# Patient Record
Sex: Female | Born: 1985 | Hispanic: No | Marital: Single | Smoking: Never smoker
Health system: Southern US, Community
[De-identification: ages and names within clinical notes are randomized; demographics above are authoritative.]

## PROBLEM LIST (undated history)

## (undated) DIAGNOSIS — S32810A Multiple fractures of pelvis with stable disruption of pelvic ring, initial encounter for closed fracture: Secondary | ICD-10-CM

## (undated) DIAGNOSIS — S32462A Displaced associated transverse-posterior fracture of left acetabulum, initial encounter for closed fracture: Secondary | ICD-10-CM

---

## 2017-11-25 ENCOUNTER — Emergency Department (HOSPITAL_COMMUNITY): Payer: 59 | Admitting: Certified Registered Nurse Anesthetist

## 2017-11-25 ENCOUNTER — Emergency Department (HOSPITAL_COMMUNITY): Payer: 59

## 2017-11-25 ENCOUNTER — Inpatient Hospital Stay (HOSPITAL_COMMUNITY): Payer: 59

## 2017-11-25 ENCOUNTER — Encounter (HOSPITAL_COMMUNITY): Admission: EM | Disposition: A | Discharge status: Home / Self Care | Payer: Self-pay | Source: Home / Self Care

## 2017-11-25 ENCOUNTER — Emergency Department (HOSPITAL_BASED_OUTPATIENT_CLINIC_OR_DEPARTMENT_OTHER): Payer: 59

## 2017-11-25 ENCOUNTER — Inpatient Hospital Stay (HOSPITAL_COMMUNITY)
Admission: EM | Admit: 2017-11-25 | Discharge: 2017-12-06 | DRG: 956 | Disposition: A | Discharge status: Home / Self Care | Payer: 59 | Attending: General Surgery | Admitting: General Surgery

## 2017-11-25 DIAGNOSIS — S32462A Displaced associated transverse-posterior fracture of left acetabulum, initial encounter for closed fracture: Secondary | ICD-10-CM | POA: Diagnosis present

## 2017-11-25 DIAGNOSIS — S72352D Displaced comminuted fracture of shaft of left femur, subsequent encounter for closed fracture with routine healing: Secondary | ICD-10-CM

## 2017-11-25 DIAGNOSIS — S81002A Unspecified open wound, left knee, initial encounter: Secondary | ICD-10-CM | POA: Diagnosis not present

## 2017-11-25 DIAGNOSIS — T819XXA Unspecified complication of procedure, initial encounter: Secondary | ICD-10-CM

## 2017-11-25 DIAGNOSIS — S32402A Unspecified fracture of left acetabulum, initial encounter for closed fracture: Secondary | ICD-10-CM

## 2017-11-25 DIAGNOSIS — S27322A Contusion of lung, bilateral, initial encounter: Secondary | ICD-10-CM | POA: Diagnosis present

## 2017-11-25 DIAGNOSIS — D72828 Other elevated white blood cell count: Secondary | ICD-10-CM | POA: Diagnosis not present

## 2017-11-25 DIAGNOSIS — Z09 Encounter for follow-up examination after completed treatment for conditions other than malignant neoplasm: Secondary | ICD-10-CM

## 2017-11-25 DIAGNOSIS — S32423A Displaced fracture of posterior wall of unspecified acetabulum, initial encounter for closed fracture: Secondary | ICD-10-CM | POA: Diagnosis present

## 2017-11-25 DIAGNOSIS — S3282XA Multiple fractures of pelvis without disruption of pelvic ring, initial encounter for closed fracture: Secondary | ICD-10-CM | POA: Diagnosis present

## 2017-11-25 DIAGNOSIS — K661 Hemoperitoneum: Secondary | ICD-10-CM | POA: Diagnosis present

## 2017-11-25 DIAGNOSIS — I959 Hypotension, unspecified: Secondary | ICD-10-CM | POA: Diagnosis present

## 2017-11-25 DIAGNOSIS — Z938 Other artificial opening status: Secondary | ICD-10-CM

## 2017-11-25 DIAGNOSIS — J9811 Atelectasis: Secondary | ICD-10-CM | POA: Diagnosis present

## 2017-11-25 DIAGNOSIS — S32129A Unspecified Zone II fracture of sacrum, initial encounter for closed fracture: Secondary | ICD-10-CM | POA: Diagnosis present

## 2017-11-25 DIAGNOSIS — I313 Pericardial effusion (noninflammatory): Secondary | ICD-10-CM | POA: Diagnosis present

## 2017-11-25 DIAGNOSIS — S7292XA Unspecified fracture of left femur, initial encounter for closed fracture: Secondary | ICD-10-CM

## 2017-11-25 DIAGNOSIS — S72352A Displaced comminuted fracture of shaft of left femur, initial encounter for closed fracture: Principal | ICD-10-CM | POA: Diagnosis present

## 2017-11-25 DIAGNOSIS — R402412 Glasgow coma scale score 13-15, at arrival to emergency department: Secondary | ICD-10-CM | POA: Diagnosis present

## 2017-11-25 DIAGNOSIS — Z452 Encounter for adjustment and management of vascular access device: Secondary | ICD-10-CM

## 2017-11-25 DIAGNOSIS — Z4682 Encounter for fitting and adjustment of non-vascular catheter: Secondary | ICD-10-CM

## 2017-11-25 DIAGNOSIS — E861 Hypovolemia: Secondary | ICD-10-CM

## 2017-11-25 DIAGNOSIS — I9589 Other hypotension: Secondary | ICD-10-CM

## 2017-11-25 DIAGNOSIS — S32810A Multiple fractures of pelvis with stable disruption of pelvic ring, initial encounter for closed fracture: Secondary | ICD-10-CM | POA: Diagnosis present

## 2017-11-25 DIAGNOSIS — S81012A Laceration without foreign body, left knee, initial encounter: Secondary | ICD-10-CM | POA: Diagnosis present

## 2017-11-25 DIAGNOSIS — D62 Acute posthemorrhagic anemia: Secondary | ICD-10-CM | POA: Diagnosis not present

## 2017-11-25 DIAGNOSIS — T1490XA Injury, unspecified, initial encounter: Secondary | ICD-10-CM

## 2017-11-25 DIAGNOSIS — R52 Pain, unspecified: Secondary | ICD-10-CM

## 2017-11-25 DIAGNOSIS — Y92410 Unspecified street and highway as the place of occurrence of the external cause: Secondary | ICD-10-CM

## 2017-11-25 DIAGNOSIS — I312 Hemopericardium, not elsewhere classified: Secondary | ICD-10-CM | POA: Diagnosis present

## 2017-11-25 DIAGNOSIS — M25521 Pain in right elbow: Secondary | ICD-10-CM | POA: Diagnosis not present

## 2017-11-25 DIAGNOSIS — R609 Edema, unspecified: Secondary | ICD-10-CM

## 2017-11-25 DIAGNOSIS — Z419 Encounter for procedure for purposes other than remedying health state, unspecified: Secondary | ICD-10-CM

## 2017-11-25 DIAGNOSIS — R58 Hemorrhage, not elsewhere classified: Secondary | ICD-10-CM

## 2017-11-25 HISTORY — PX: SUBXYPHOID PERICARDIAL WINDOW: SHX5075

## 2017-11-25 HISTORY — DX: Displaced associated transverse-posterior fracture of left acetabulum, initial encounter for closed fracture: S32.462A

## 2017-11-25 HISTORY — PX: EXTERNAL FIXATION LEG: SHX1549

## 2017-11-25 HISTORY — DX: Multiple fractures of pelvis with stable disruption of pelvic ring, initial encounter for closed fracture: S32.810A

## 2017-11-25 LAB — BPAM FFP
BLOOD PRODUCT EXPIRATION DATE: 201904132359
Blood Product Expiration Date: 201904122359
ISSUE DATE / TIME: 201904070455
ISSUE DATE / TIME: 201904070455
UNIT TYPE AND RH: 6200
UNIT TYPE AND RH: 6200

## 2017-11-25 LAB — I-STAT CHEM 8, ED
BUN: 7 mg/dL (ref 6–20)
CALCIUM ION: 1.09 mmol/L — AB (ref 1.15–1.40)
CHLORIDE: 107 mmol/L (ref 101–111)
CREATININE: 1 mg/dL (ref 0.44–1.00)
Glucose, Bld: 197 mg/dL — ABNORMAL HIGH (ref 65–99)
HCT: 36 % (ref 36.0–46.0)
Hemoglobin: 12.2 g/dL (ref 12.0–15.0)
Potassium: 2.7 mmol/L — CL (ref 3.5–5.1)
Sodium: 142 mmol/L (ref 135–145)
TCO2: 19 mmol/L — AB (ref 22–32)

## 2017-11-25 LAB — COMPREHENSIVE METABOLIC PANEL
ALK PHOS: 59 U/L (ref 38–126)
ALT: 72 U/L — ABNORMAL HIGH (ref 14–54)
ANION GAP: 13 (ref 5–15)
AST: 171 U/L — ABNORMAL HIGH (ref 15–41)
Albumin: 3.5 g/dL (ref 3.5–5.0)
BILIRUBIN TOTAL: 0.6 mg/dL (ref 0.3–1.2)
BUN: 8 mg/dL (ref 6–20)
CO2: 17 mmol/L — AB (ref 22–32)
CREATININE: 1.19 mg/dL — AB (ref 0.44–1.00)
Calcium: 7.8 mg/dL — ABNORMAL LOW (ref 8.9–10.3)
Chloride: 109 mmol/L (ref 101–111)
GFR calc Af Amer: 31 mL/min — ABNORMAL LOW (ref >60)
GFR, EST NON AFRICAN AMERICAN: 27 mL/min — AB (ref >60)
GLUCOSE: 204 mg/dL — AB (ref 65–99)
POTASSIUM: 2.8 mmol/L — AB (ref 3.5–5.1)
Sodium: 139 mmol/L (ref 135–145)
Total Protein: 6.5 g/dL (ref 6.5–8.1)

## 2017-11-25 LAB — CBC WITH DIFFERENTIAL/PLATELET
Basophils Absolute: 0 10*3/uL (ref 0.0–0.1)
Basophils Relative: 0 %
EOS PCT: 0 %
Eosinophils Absolute: 0 10*3/uL (ref 0.0–0.7)
HCT: 29.4 % — ABNORMAL LOW (ref 36.0–46.0)
Hemoglobin: 9.5 g/dL — ABNORMAL LOW (ref 12.0–15.0)
LYMPHS ABS: 1.3 10*3/uL (ref 0.7–4.0)
LYMPHS PCT: 7 %
MCH: 27.5 pg (ref 26.0–34.0)
MCHC: 32.3 g/dL (ref 30.0–36.0)
MCV: 85.2 fL (ref 78.0–100.0)
MONO ABS: 1.3 10*3/uL — AB (ref 0.1–1.0)
MONOS PCT: 7 %
Neutro Abs: 15 10*3/uL — ABNORMAL HIGH (ref 1.7–7.7)
Neutrophils Relative %: 86 %
PLATELETS: 148 10*3/uL — AB (ref 150–400)
RBC: 3.45 MIL/uL — ABNORMAL LOW (ref 3.87–5.11)
RDW: 13.6 % (ref 11.5–15.5)
WBC: 17.5 10*3/uL — ABNORMAL HIGH (ref 4.0–10.5)

## 2017-11-25 LAB — LACTIC ACID, PLASMA: Lactic Acid, Venous: 3.3 mmol/L (ref 0.5–1.9)

## 2017-11-25 LAB — PREPARE FRESH FROZEN PLASMA
UNIT DIVISION: 0
Unit division: 0

## 2017-11-25 LAB — PREPARE RBC (CROSSMATCH)

## 2017-11-25 LAB — PROTIME-INR
INR: 1.3
Prothrombin Time: 16.1 seconds — ABNORMAL HIGH (ref 11.4–15.2)

## 2017-11-25 LAB — CBC
HCT: 36.3 % (ref 36.0–46.0)
HEMOGLOBIN: 11.7 g/dL — AB (ref 12.0–15.0)
MCH: 27.7 pg (ref 26.0–34.0)
MCHC: 32.2 g/dL (ref 30.0–36.0)
MCV: 86 fL (ref 78.0–100.0)
PLATELETS: 218 10*3/uL (ref 150–400)
RBC: 4.22 MIL/uL (ref 3.87–5.11)
RDW: 13.5 % (ref 11.5–15.5)
WBC: 22.6 10*3/uL — ABNORMAL HIGH (ref 4.0–10.5)

## 2017-11-25 LAB — BLOOD PRODUCT ORDER (VERBAL) VERIFICATION

## 2017-11-25 LAB — ECHO INTRAOPERATIVE TEE
Height: 65 in
Weight: 2080 oz

## 2017-11-25 LAB — I-STAT BETA HCG BLOOD, ED (MC, WL, AP ONLY): I-stat hCG, quantitative: <5 m[IU]/mL (ref <5)

## 2017-11-25 LAB — BASIC METABOLIC PANEL
Anion gap: 9 (ref 5–15)
BUN: 8 mg/dL (ref 6–20)
CO2: 20 mmol/L — ABNORMAL LOW (ref 22–32)
Calcium: 7.6 mg/dL — ABNORMAL LOW (ref 8.9–10.3)
Chloride: 110 mmol/L (ref 101–111)
Creatinine, Ser: 0.94 mg/dL (ref 0.44–1.00)
GFR calc Af Amer: >60 mL/min (ref >60)
GLUCOSE: 121 mg/dL — AB (ref 65–99)
POTASSIUM: 4.6 mmol/L (ref 3.5–5.1)
Sodium: 139 mmol/L (ref 135–145)

## 2017-11-25 LAB — URINALYSIS, ROUTINE W REFLEX MICROSCOPIC
BACTERIA UA: NONE SEEN
BILIRUBIN URINE: NEGATIVE
Glucose, UA: NEGATIVE mg/dL
KETONES UR: NEGATIVE mg/dL
LEUKOCYTES UA: NEGATIVE
Nitrite: NEGATIVE
Protein, ur: NEGATIVE mg/dL
Specific Gravity, Urine: 1.024 (ref 1.005–1.030)
pH: 5 (ref 5.0–8.0)

## 2017-11-25 LAB — I-STAT CG4 LACTIC ACID, ED: LACTIC ACID, VENOUS: 5.19 mmol/L — AB (ref 0.5–1.9)

## 2017-11-25 LAB — CDS SEROLOGY

## 2017-11-25 LAB — ETHANOL: Alcohol, Ethyl (B): <10 mg/dL (ref <10)

## 2017-11-25 LAB — ECHOCARDIOGRAM LIMITED
HEIGHTINCHES: 65 in
WEIGHTICAEL: 2080 [oz_av]

## 2017-11-25 LAB — ABO/RH: ABO/RH(D): B POS

## 2017-11-25 SURGERY — CREATION, PERICARDIAL WINDOW, SUBXIPHOID APPROACH
Anesthesia: General | Site: Leg Lower

## 2017-11-25 MED ORDER — 0.9 % SODIUM CHLORIDE (POUR BTL) OPTIME
TOPICAL | Status: DC | PRN
  Administered 2017-11-25: 3000 mL

## 2017-11-25 MED ORDER — LACTATED RINGERS IV SOLN
INTRAVENOUS | Status: DC | PRN
  Administered 2017-11-25: 08:00:00 via INTRAVENOUS

## 2017-11-25 MED ORDER — SODIUM CHLORIDE 0.9 % IV SOLN
INTRAVENOUS | Status: DC
  Filled 2017-11-25: qty 30

## 2017-11-25 MED ORDER — PANTOPRAZOLE SODIUM 40 MG PO TBEC
40.0000 mg | DELAYED_RELEASE_TABLET | Freq: Every day | ORAL | Status: DC
  Administered 2017-11-25 – 2017-12-06 (×11): 40 mg via ORAL
  Filled 2017-11-25 (×11): qty 1

## 2017-11-25 MED ORDER — SODIUM CHLORIDE 0.9 % IV BOLUS
500.0000 mL | Freq: Once | INTRAVENOUS | Status: AC
  Administered 2017-11-25: 500 mL via INTRAVENOUS

## 2017-11-25 MED ORDER — ROCURONIUM BROMIDE 10 MG/ML (PF) SYRINGE
PREFILLED_SYRINGE | INTRAVENOUS | Status: DC | PRN
  Administered 2017-11-25: 50 mg via INTRAVENOUS

## 2017-11-25 MED ORDER — LIDOCAINE 2% (20 MG/ML) 5 ML SYRINGE
INTRAMUSCULAR | Status: DC | PRN
  Administered 2017-11-25: 100 mg via INTRAVENOUS

## 2017-11-25 MED ORDER — MAGNESIUM SULFATE 50 % IJ SOLN
40.0000 meq | INTRAMUSCULAR | Status: DC
  Filled 2017-11-25: qty 9.85

## 2017-11-25 MED ORDER — MIDAZOLAM HCL 10 MG/2ML IJ SOLN
INTRAMUSCULAR | Status: AC
  Filled 2017-11-25: qty 2

## 2017-11-25 MED ORDER — IOPAMIDOL (ISOVUE-300) INJECTION 61%
100.0000 mL | Freq: Once | INTRAVENOUS | Status: AC | PRN
  Administered 2017-11-25: 100 mL via INTRAVENOUS

## 2017-11-25 MED ORDER — HYDROMORPHONE HCL 1 MG/ML IJ SOLN
INTRAMUSCULAR | Status: AC
  Administered 2017-11-25: 0.5 mg via INTRAVENOUS
  Filled 2017-11-25: qty 1

## 2017-11-25 MED ORDER — PLASMA-LYTE 148 IV SOLN
INTRAVENOUS | Status: DC
  Filled 2017-11-25: qty 2.5

## 2017-11-25 MED ORDER — KETAMINE HCL 10 MG/ML IJ SOLN
INTRAMUSCULAR | Status: DC | PRN
  Administered 2017-11-25: 200 mg via INTRAVENOUS

## 2017-11-25 MED ORDER — HEMOSTATIC AGENTS (NO CHARGE) OPTIME
TOPICAL | Status: DC | PRN
  Administered 2017-11-25: 1 via TOPICAL

## 2017-11-25 MED ORDER — ONDANSETRON 4 MG PO TBDP
4.0000 mg | ORAL_TABLET | Freq: Four times a day (QID) | ORAL | Status: DC | PRN
  Filled 2017-11-25: qty 1

## 2017-11-25 MED ORDER — SODIUM CHLORIDE 0.9 % IV SOLN
1.5000 g | INTRAVENOUS | Status: AC
  Administered 2017-11-25: 1.5 g via INTRAVENOUS
  Filled 2017-11-25: qty 1.5

## 2017-11-25 MED ORDER — HYDROMORPHONE HCL 1 MG/ML IJ SOLN
0.5000 mg | INTRAMUSCULAR | Status: DC | PRN
  Administered 2017-11-25 – 2017-11-29 (×19): 1 mg via INTRAVENOUS
  Administered 2017-11-29 (×2): 0.5 mg via INTRAVENOUS
  Administered 2017-11-30 – 2017-12-03 (×19): 1 mg via INTRAVENOUS
  Filled 2017-11-25 (×33): qty 1
  Filled 2017-11-25: qty 0.5
  Filled 2017-11-25: qty 1
  Filled 2017-11-25: qty 0.5
  Filled 2017-11-25 (×4): qty 1

## 2017-11-25 MED ORDER — OXYCODONE HCL 5 MG PO TABS
5.0000 mg | ORAL_TABLET | ORAL | Status: DC | PRN
  Administered 2017-11-25 – 2017-11-29 (×3): 5 mg via ORAL
  Filled 2017-11-25 (×3): qty 1

## 2017-11-25 MED ORDER — SODIUM CHLORIDE 0.9 % IV SOLN
750.0000 mg | INTRAVENOUS | Status: DC
  Filled 2017-11-25: qty 750

## 2017-11-25 MED ORDER — POTASSIUM CHLORIDE 2 MEQ/ML IV SOLN
80.0000 meq | INTRAVENOUS | Status: DC
  Filled 2017-11-25: qty 40

## 2017-11-25 MED ORDER — PROPOFOL 10 MG/ML IV BOLUS
INTRAVENOUS | Status: AC
  Filled 2017-11-25: qty 20

## 2017-11-25 MED ORDER — NITROGLYCERIN IN D5W 200-5 MCG/ML-% IV SOLN
2.0000 ug/min | INTRAVENOUS | Status: DC
  Filled 2017-11-25: qty 250

## 2017-11-25 MED ORDER — PANTOPRAZOLE SODIUM 40 MG IV SOLR: 40.0000 mg | Freq: Every day | INTRAVENOUS | Status: DC

## 2017-11-25 MED ORDER — TRANEXAMIC ACID (OHS) BOLUS VIA INFUSION
15.0000 mg/kg | INTRAVENOUS | Status: DC
  Filled 2017-11-25: qty 885

## 2017-11-25 MED ORDER — ONDANSETRON HCL 4 MG/2ML IJ SOLN: 4.0000 mg | Freq: Four times a day (QID) | INTRAMUSCULAR | Status: DC | PRN

## 2017-11-25 MED ORDER — CEFAZOLIN SODIUM-DEXTROSE 1-4 GM/50ML-% IV SOLN
1.0000 g | Freq: Once | INTRAVENOUS | Status: AC
  Administered 2017-11-25: 1 g via INTRAVENOUS
  Filled 2017-11-25: qty 50

## 2017-11-25 MED ORDER — MIDAZOLAM HCL 5 MG/5ML IJ SOLN
INTRAMUSCULAR | Status: DC | PRN
  Administered 2017-11-25 (×4): 1 mg via INTRAVENOUS

## 2017-11-25 MED ORDER — PROMETHAZINE HCL 25 MG/ML IJ SOLN: 6.2500 mg | INTRAMUSCULAR | Status: DC | PRN

## 2017-11-25 MED ORDER — SODIUM CHLORIDE 0.9 % IV SOLN
30.0000 ug/min | INTRAVENOUS | Status: DC
  Filled 2017-11-25: qty 2

## 2017-11-25 MED ORDER — CEFAZOLIN SODIUM-DEXTROSE 1-4 GM/50ML-% IV SOLN
1.0000 g | Freq: Three times a day (TID) | INTRAVENOUS | Status: AC
  Administered 2017-11-25 – 2017-11-26 (×3): 1 g via INTRAVENOUS
  Filled 2017-11-25 (×3): qty 50

## 2017-11-25 MED ORDER — DEXMEDETOMIDINE HCL IN NACL 400 MCG/100ML IV SOLN
0.1000 ug/kg/h | INTRAVENOUS | Status: DC
  Filled 2017-11-25: qty 100

## 2017-11-25 MED ORDER — GLYCOPYRROLATE 0.2 MG/ML IJ SOLN
INTRAMUSCULAR | Status: DC | PRN
  Administered 2017-11-25: 0.4 mg via INTRAVENOUS

## 2017-11-25 MED ORDER — SODIUM CHLORIDE 0.9 % IV SOLN: Freq: Once | INTRAVENOUS | Status: DC

## 2017-11-25 MED ORDER — SUCCINYLCHOLINE CHLORIDE 200 MG/10ML IV SOSY
PREFILLED_SYRINGE | INTRAVENOUS | Status: DC | PRN
  Administered 2017-11-25: 120 mg via INTRAVENOUS

## 2017-11-25 MED ORDER — ALBUMIN HUMAN 5 % IV SOLN
INTRAVENOUS | Status: DC | PRN
  Administered 2017-11-25: 08:00:00 via INTRAVENOUS

## 2017-11-25 MED ORDER — PHENYLEPHRINE HCL 10 MG/ML IJ SOLN
INTRAVENOUS | Status: DC | PRN
  Administered 2017-11-25: 25 ug/min via INTRAVENOUS

## 2017-11-25 MED ORDER — IOPAMIDOL (ISOVUE-300) INJECTION 61%
INTRAVENOUS | Status: AC
  Filled 2017-11-25: qty 100

## 2017-11-25 MED ORDER — KETAMINE HCL 10 MG/ML IJ SOLN
INTRAMUSCULAR | Status: AC
  Filled 2017-11-25: qty 1

## 2017-11-25 MED ORDER — VANCOMYCIN HCL 10 G IV SOLR
1250.0000 mg | INTRAVENOUS | Status: DC
  Filled 2017-11-25: qty 1250

## 2017-11-25 MED ORDER — LACTATED RINGERS IV SOLN
INTRAVENOUS | Status: DC
  Administered 2017-11-25 – 2017-11-28 (×7): via INTRAVENOUS

## 2017-11-25 MED ORDER — NEOSTIGMINE METHYLSULFATE 10 MG/10ML IV SOLN
INTRAVENOUS | Status: DC | PRN
  Administered 2017-11-25: 3 mg via INTRAVENOUS

## 2017-11-25 MED ORDER — TRANEXAMIC ACID 1000 MG/10ML IV SOLN
1.5000 mg/kg/h | INTRAVENOUS | Status: DC
  Filled 2017-11-25: qty 25

## 2017-11-25 MED ORDER — SODIUM CHLORIDE 0.9 % IV SOLN
10.0000 mL/h | Freq: Once | INTRAVENOUS | Status: AC
  Administered 2017-11-27: 10 mL/h via INTRAVENOUS

## 2017-11-25 MED ORDER — FENTANYL CITRATE (PF) 250 MCG/5ML IJ SOLN
INTRAMUSCULAR | Status: DC | PRN
  Administered 2017-11-25: 100 ug via INTRAVENOUS
  Administered 2017-11-25 (×4): 50 ug via INTRAVENOUS

## 2017-11-25 MED ORDER — EPINEPHRINE PF 1 MG/ML IJ SOLN
0.0000 ug/min | INTRAVENOUS | Status: DC
  Filled 2017-11-25: qty 4

## 2017-11-25 MED ORDER — TETANUS-DIPHTH-ACELL PERTUSSIS 5-2.5-18.5 LF-MCG/0.5 IM SUSP
0.5000 mL | Freq: Once | INTRAMUSCULAR | Status: AC
Start: 2017-11-25 — End: 2017-11-25
  Administered 2017-11-25: 0.5 mL via INTRAMUSCULAR
  Filled 2017-11-25: qty 0.5

## 2017-11-25 MED ORDER — DOPAMINE-DEXTROSE 3.2-5 MG/ML-% IV SOLN
0.0000 ug/kg/min | INTRAVENOUS | Status: DC
  Filled 2017-11-25: qty 250

## 2017-11-25 MED ORDER — GELATIN ABSORBABLE MT POWD
OROMUCOSAL | Status: DC | PRN
  Administered 2017-11-25 (×3): via TOPICAL

## 2017-11-25 MED ORDER — SODIUM CHLORIDE 0.9 % IV SOLN
INTRAVENOUS | Status: DC
  Filled 2017-11-25: qty 1

## 2017-11-25 MED ORDER — ACETAMINOPHEN 500 MG PO TABS
1000.0000 mg | ORAL_TABLET | Freq: Three times a day (TID) | ORAL | Status: DC
  Administered 2017-11-25 – 2017-12-02 (×17): 1000 mg via ORAL
  Filled 2017-11-25 (×19): qty 2

## 2017-11-25 MED ORDER — HYDROMORPHONE HCL 1 MG/ML IJ SOLN
0.2500 mg | INTRAMUSCULAR | Status: DC | PRN
  Administered 2017-11-25: 0.5 mg via INTRAVENOUS

## 2017-11-25 MED ORDER — FENTANYL CITRATE (PF) 250 MCG/5ML IJ SOLN
INTRAMUSCULAR | Status: AC
  Filled 2017-11-25: qty 20

## 2017-11-25 MED ORDER — MIDAZOLAM HCL 2 MG/2ML IJ SOLN
INTRAMUSCULAR | Status: AC
  Filled 2017-11-25: qty 2

## 2017-11-25 MED ORDER — TRANEXAMIC ACID (OHS) PUMP PRIME SOLUTION
2.0000 mg/kg | INTRAVENOUS | Status: DC
  Filled 2017-11-25: qty 1.18

## 2017-11-25 MED ORDER — ONDANSETRON HCL 4 MG/2ML IJ SOLN
INTRAMUSCULAR | Status: DC | PRN
  Administered 2017-11-25: 4 mg via INTRAVENOUS

## 2017-11-25 SURGICAL SUPPLY — 115 items
5mm x 200mm x 45mm pin (Pin) ×16 IMPLANT
BAG DECANTER FOR FLEXI CONT (MISCELLANEOUS) IMPLANT
BANDAGE ACE 4X5 VEL STRL LF (GAUZE/BANDAGES/DRESSINGS) ×10 IMPLANT
BANDAGE ACE 6X5 VEL STRL LF (GAUZE/BANDAGES/DRESSINGS) IMPLANT
BAR GLASS FIBER EXFX 11X400 (EXFIX) ×10 IMPLANT
BASKET HEART  (ORDER IN 25'S) (MISCELLANEOUS)
BASKET HEART (ORDER IN 25'S) (MISCELLANEOUS)
BASKET HEART (ORDER IN 25S) (MISCELLANEOUS) IMPLANT
BIT DRILL ORANGE 4.0 LONG (BIT) ×5 IMPLANT
BLADE 10 SAFETY STRL DISP (BLADE) ×5 IMPLANT
BLADE 15 SAFETY STRL DISP (BLADE) ×5 IMPLANT
BLADE STERNUM SYSTEM 6 (BLADE) ×5 IMPLANT
BNDG GAUZE ELAST 4 BULKY (GAUZE/BANDAGES/DRESSINGS) ×10 IMPLANT
CANISTER SUCT 3000ML PPV (MISCELLANEOUS) ×5 IMPLANT
CANNULA EZ GLIDE AORTIC 21FR (CANNULA) IMPLANT
CATH CPB KIT HENDRICKSON (MISCELLANEOUS) IMPLANT
CATH ROBINSON RED A/P 18FR (CATHETERS) IMPLANT
CATH THORACIC 36FR (CATHETERS) IMPLANT
CATH THORACIC 36FR RT ANG (CATHETERS) IMPLANT
CLIP VESOCCLUDE MED 24/CT (CLIP) IMPLANT
CLIP VESOCCLUDE SM WIDE 24/CT (CLIP) IMPLANT
CONT SPEC 4OZ CLIKSEAL STRL BL (MISCELLANEOUS) ×5 IMPLANT
CRADLE DONUT ADULT HEAD (MISCELLANEOUS) ×5 IMPLANT
DERMABOND ADVANCED (GAUZE/BANDAGES/DRESSINGS) ×2
DERMABOND ADVANCED .7 DNX12 (GAUZE/BANDAGES/DRESSINGS) ×3 IMPLANT
DRAPE BILATERAL SPLIT (DRAPES) ×10 IMPLANT
DRAPE C-ARM 42X72 X-RAY (DRAPES) ×5 IMPLANT
DRAPE CARDIOVASCULAR INCISE (DRAPES) ×2
DRAPE HALF SHEET 40X57 (DRAPES) ×5 IMPLANT
DRAPE INCISE IOBAN 66X45 STRL (DRAPES) ×5 IMPLANT
DRAPE SLUSH/WARMER DISC (DRAPES) ×5 IMPLANT
DRAPE SRG 135X102X78XABS (DRAPES) ×3 IMPLANT
DRSG COVADERM 4X14 (GAUZE/BANDAGES/DRESSINGS) ×5 IMPLANT
ELECT BLADE 6.5 EXT (BLADE) ×5 IMPLANT
ELECT REM PT RETURN 9FT ADLT (ELECTROSURGICAL) ×5
ELECTRODE REM PT RTRN 9FT ADLT (ELECTROSURGICAL) ×3 IMPLANT
FELT TEFLON 1X6 (MISCELLANEOUS) IMPLANT
GAUZE SPONGE 4X4 12PLY STRL (GAUZE/BANDAGES/DRESSINGS) ×5 IMPLANT
GAUZE SPONGE 4X4 12PLY STRL LF (GAUZE/BANDAGES/DRESSINGS) ×5 IMPLANT
GAUZE XEROFORM 1X8 LF (GAUZE/BANDAGES/DRESSINGS) ×10 IMPLANT
GLOVE BIOGEL PI IND STRL 6 (GLOVE) ×3 IMPLANT
GLOVE BIOGEL PI IND STRL 7.0 (GLOVE) ×6 IMPLANT
GLOVE BIOGEL PI IND STRL 7.5 (GLOVE) ×3 IMPLANT
GLOVE BIOGEL PI INDICATOR 6 (GLOVE) ×2
GLOVE BIOGEL PI INDICATOR 7.0 (GLOVE) ×4
GLOVE BIOGEL PI INDICATOR 7.5 (GLOVE) ×2
GLOVE ECLIPSE 7.5 STRL STRAW (GLOVE) ×10 IMPLANT
GLOVE SKINSENSE NS SZ7.0 (GLOVE) ×2
GLOVE SKINSENSE NS SZ7.5 (GLOVE) ×2
GLOVE SKINSENSE STRL SZ7.0 (GLOVE) ×3 IMPLANT
GLOVE SKINSENSE STRL SZ7.5 (GLOVE) ×3 IMPLANT
GLOVE SURG SIGNA 7.5 PF LTX (GLOVE) ×10 IMPLANT
GLOVE SURG SYN 7.5  E (GLOVE) ×4
GLOVE SURG SYN 7.5 E (GLOVE) ×6 IMPLANT
GOWN STRL REUS W/ TWL LRG LVL3 (GOWN DISPOSABLE) ×12 IMPLANT
GOWN STRL REUS W/ TWL XL LVL3 (GOWN DISPOSABLE) ×9 IMPLANT
GOWN STRL REUS W/TWL LRG LVL3 (GOWN DISPOSABLE) ×8
GOWN STRL REUS W/TWL XL LVL3 (GOWN DISPOSABLE) ×6
HEMOSTAT POWDER SURGIFOAM 1G (HEMOSTASIS) ×15 IMPLANT
HEMOSTAT SURGICEL 2X14 (HEMOSTASIS) ×5 IMPLANT
INSERT FOGARTY XLG (MISCELLANEOUS) IMPLANT
KIT BASIN OR (CUSTOM PROCEDURE TRAY) ×5 IMPLANT
KIT SUCTION CATH 14FR (SUCTIONS) ×5 IMPLANT
KIT TURNOVER KIT B (KITS) ×5 IMPLANT
KIT VASOVIEW HEMOPRO VH 3000 (KITS) IMPLANT
MARKER GRAFT CORONARY BYPASS (MISCELLANEOUS) IMPLANT
MARKER SKIN DUAL TIP RULER LAB (MISCELLANEOUS) ×5 IMPLANT
NS IRRIG 1000ML POUR BTL (IV SOLUTION) ×15 IMPLANT
PACK E OPEN HEART (SUTURE) ×5 IMPLANT
PACK OPEN HEART (CUSTOM PROCEDURE TRAY) ×5 IMPLANT
PACK UNIVERSAL I (CUSTOM PROCEDURE TRAY) ×5 IMPLANT
PAD ARMBOARD 7.5X6 YLW CONV (MISCELLANEOUS) ×10 IMPLANT
PAD ELECT DEFIB RADIOL ZOLL (MISCELLANEOUS) ×5 IMPLANT
PADDING CAST COTTON 6X4 STRL (CAST SUPPLIES) ×5 IMPLANT
PENCIL BUTTON HOLSTER BLD 10FT (ELECTRODE) ×5 IMPLANT
PIN CLAMP 2BAR 75MM BLUE (EXFIX) ×10 IMPLANT
PIN XTRAFIX LG BLUNT 5X200X45 (PIN) ×20 IMPLANT
PUNCH AORTIC ROTATE 4.0MM (MISCELLANEOUS) IMPLANT
PUNCH AORTIC ROTATE 4.5MM 8IN (MISCELLANEOUS) IMPLANT
PUNCH AORTIC ROTATE 5MM 8IN (MISCELLANEOUS) IMPLANT
SUT BONE WAX W31G (SUTURE) IMPLANT
SUT ETHILON 3 0 PS 1 (SUTURE) ×10 IMPLANT
SUT MNCRL AB 4-0 PS2 18 (SUTURE) IMPLANT
SUT PROLENE 3 0 SH DA (SUTURE) IMPLANT
SUT PROLENE 4 0 RB 1 (SUTURE)
SUT PROLENE 4 0 SH DA (SUTURE) IMPLANT
SUT PROLENE 4-0 RB1 .5 CRCL 36 (SUTURE) IMPLANT
SUT PROLENE 6 0 C 1 30 (SUTURE) ×10 IMPLANT
SUT PROLENE 7 0 BV1 MDA (SUTURE) IMPLANT
SUT PROLENE 8 0 BV175 6 (SUTURE) IMPLANT
SUT STEEL 6MS V (SUTURE) IMPLANT
SUT STEEL STERNAL CCS#1 18IN (SUTURE) IMPLANT
SUT STEEL SZ 6 DBL 3X14 BALL (SUTURE) IMPLANT
SUT VIC AB 1 CTX 36 (SUTURE) ×4
SUT VIC AB 1 CTX36XBRD ANBCTR (SUTURE) ×6 IMPLANT
SUT VIC AB 2-0 CT1 27 (SUTURE)
SUT VIC AB 2-0 CT1 TAPERPNT 27 (SUTURE) IMPLANT
SUT VIC AB 2-0 CTX 27 (SUTURE) IMPLANT
SUT VIC AB 3-0 SH 27 (SUTURE)
SUT VIC AB 3-0 SH 27X BRD (SUTURE) IMPLANT
SUT VIC AB 3-0 X1 27 (SUTURE) IMPLANT
SUT VICRYL 4-0 PS2 18IN ABS (SUTURE) IMPLANT
SYSTEM SAHARA CHEST DRAIN ATS (WOUND CARE) ×5 IMPLANT
TAPE CLOTH SURG 4X10 WHT LF (GAUZE/BANDAGES/DRESSINGS) ×5 IMPLANT
TOWEL GREEN STERILE (TOWEL DISPOSABLE) ×5 IMPLANT
TOWEL GREEN STERILE FF (TOWEL DISPOSABLE) ×5 IMPLANT
TRAP SPECIMEN MUCOUS 40CC (MISCELLANEOUS) ×5 IMPLANT
TRAY FOLEY SILVER 16FR TEMP (SET/KITS/TRAYS/PACK) ×5 IMPLANT
TUBE CONNECTING 12'X1/4 (SUCTIONS) ×1
TUBE CONNECTING 12X1/4 (SUCTIONS) ×4 IMPLANT
TUBE FEEDING 8FR 16IN STR KANG (MISCELLANEOUS) IMPLANT
TUBING INSUFFLATION (TUBING) IMPLANT
UNDERPAD 30X30 (UNDERPADS AND DIAPERS) ×5 IMPLANT
WATER STERILE IRR 1000ML POUR (IV SOLUTION) ×10 IMPLANT
YANKAUER SUCT BULB TIP NO VENT (SUCTIONS) ×5 IMPLANT

## 2017-11-25 NOTE — Progress Notes (Signed)
Awaiting admit orders

## 2017-11-25 NOTE — Anesthesia Postprocedure Evaluation (Signed)
Anesthesia Post Note  Patient: Kristen LanceJessika Cook  Procedure(s) Performed: REPAIR OF CARDIAC INJURY, pericardial window (N/A Chest) EXTERNAL FIXATION LEG (Left Leg Lower)     Patient location during evaluation: PACU Anesthesia Type: General Level of consciousness: sedated Pain management: pain level controlled Vital Signs Assessment: post-procedure vital signs reviewed and stable Respiratory status: spontaneous breathing and respiratory function stable Cardiovascular status: stable Postop Assessment: no apparent nausea or vomiting Anesthetic complications: no    Last Vitals:  Vitals:   11/25/17 1230 11/25/17 1245  BP:    Pulse: 98 85  Resp: 17 10  Temp:  (!) 36.2 C  SpO2: 98% 100%    Last Pain:  Vitals:   11/25/17 0740  TempSrc: Temporal  PainSc:                  [name] DANIEL

## 2017-11-25 NOTE — Progress Notes (Signed)
      [address]       [place] [id number]             [phone]       Seen in PACU postop  Sleepy but arousable  BP (!) 127/94 (BP Location: Right Arm)   Pulse 98   Temp (!) 97.2 F (36.2 C)   Resp 13   Ht 5\' 5"  (1.651 m)   Wt 130 lb (59 kg)   LMP  (LMP Unknown)   SpO2 100%   BMI 21.63 kg/m  Minimal output from pericardial drain  Hemodynamics did improve post drainage.  No indication of major cardiac injury  Salvatore DecentSteven C. Dorris FetchHendrickson, MD [facility] [phone]

## 2017-11-25 NOTE — Anesthesia Procedure Notes (Addendum)
Procedure Name: Intubation Date/Time: 11/25/2017 8:48 AM Performed by: Clearnce Sorrel, CRNA Pre-anesthesia Checklist: Patient identified, Emergency Drugs available, Suction available, Patient being monitored and Timeout performed Patient Re-evaluated:Patient Re-evaluated prior to induction Oxygen Delivery Method: Circle system utilized Preoxygenation: Pre-oxygenation with 100% oxygen Induction Type: Rapid sequence, Cricoid Pressure applied and IV induction Ventilation: Mask ventilation without difficulty Laryngoscope Size: Mac and 3 Grade View: Grade I Tube type: Subglottic suction tube Tube size: 8.0 mm Number of attempts: 1 Airway Equipment and Method: Stylet Placement Confirmation: ETT inserted through vocal cords under direct vision,  positive ETCO2,  CO2 detector and breath sounds checked- equal and bilateral Secured at: 21 cm Tube secured with: Tape Dental Injury: Teeth and Oropharynx as per pre-operative assessment

## 2017-11-25 NOTE — Progress Notes (Signed)
  Echocardiogram 2D Echocardiogram limited has been performed.  Kristen SavoyCasey N Keenan Cook 11/25/2017, 8:05 AM

## 2017-11-25 NOTE — Progress Notes (Signed)
Pt may go to 4E per Dr Lindie SpruceWyatt

## 2017-11-25 NOTE — Progress Notes (Signed)
Patient resting comfortably.  Oxygen saturations about 95% on 2L.  Needs to use Incentive spirometer.    Marta LamasJames O. Gae BonWyatt, III, MD, FACS [phone] Trauma Surgeon

## 2017-11-25 NOTE — Progress Notes (Signed)
xrays in progress

## 2017-11-25 NOTE — Progress Notes (Signed)
   11/25/17 1750  Clinical Encounter Type  Visited With Patient and family together;Health care provider  Visit Type Follow-up;Spiritual support  Spiritual Encounters  Spiritual Needs Emotional;Prayer (Thankful)   Following up on this patient that I had not met yet.  Family arrived from [place] and were at bedside.  Indicated more surgeries to follow but so very thankful knowing it could have been worse.  Family appreciated the support.  I suggested they check with Spiritual Care office tomorrow about possible use of [facility].  Will follow and support as needed. Chaplain Katherene Ponto

## 2017-11-25 NOTE — Anesthesia Procedure Notes (Signed)
Arterial Line Insertion Start/End4/02/2018 8:15 AM, 11/25/2017 8:20 AM Performed by: Rachel MouldsLee, Heather B, CRNA  Preanesthetic checklist: patient identified, IV checked, site marked, risks and benefits discussed, surgical consent, monitors and equipment checked, pre-op evaluation and timeout performed Lidocaine 1% used for infiltration and patient sedated Left, radial was placed Catheter size: 20 G Hand hygiene performed  and maximum sterile barriers used  Allen's test indicative of satisfactory collateral circulation Attempts: 1 Procedure performed without using ultrasound guided technique. Following insertion, Biopatch and dressing applied. Post procedure assessment: normal  Post procedure complications: local hematoma. Patient tolerated the procedure well with no immediate complications.
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
## 2017-11-25 NOTE — Anesthesia Preprocedure Evaluation (Addendum)
Anesthesia Evaluation  Patient identified by MRN, date of birth, ID band Patient confused    Reviewed: Allergy & Precautions, NPO status , Patient's Chart, lab work & pertinent test results  History of Anesthesia Complications Negative for: history of anesthetic complications  Airway Mallampati: II  TM Distance: >3 FB Neck ROM: Full    Dental  (+) Teeth Intact   Pulmonary neg pulmonary ROS,    breath sounds clear to auscultation       Cardiovascular  Rhythm:Regular Rate:Tachycardia  Pericardial effusion   Neuro/Psych negative neurological ROS  negative psych ROS   GI/Hepatic negative GI ROS,   Endo/Other  negative endocrine ROS  Renal/GU negative Renal ROS     Musculoskeletal   Abdominal   Peds  Hematology   Anesthesia Other Findings   Reproductive/Obstetrics                             Anesthesia Physical Anesthesia Plan  ASA: III and emergent  Anesthesia Plan: General   Post-op Pain Management:    Induction: Rapid sequence, Cricoid pressure planned and Intravenous  PONV Risk Score and Plan: 3 and Ondansetron, Dexamethasone and Treatment may vary due to age or medical condition  Airway Management Planned: Oral ETT  Additional Equipment:   Intra-op Plan:   Post-operative Plan: Possible Post-op intubation/ventilation  Informed Consent: I have reviewed the patients History and Physical, chart, labs and discussed the procedure including the risks, benefits and alternatives for the proposed anesthesia with the patient or authorized representative who has indicated his/her understanding and acceptance.   History available from chart only and Only emergency history available  Plan Discussed with: Anesthesiologist, Surgeon and CRNA  Anesthesia Plan Comments:        Anesthesia Quick Evaluation

## 2017-11-25 NOTE — ED Notes (Signed)
Unit 1 [facility]- 0725
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
## 2017-11-25 NOTE — Progress Notes (Signed)
Chandra BatchKaren Tobin- mother [phone] or [phone]
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
## 2017-11-25 NOTE — ED Notes (Signed)
Cardiothoracic surgery at bedside

## 2017-11-25 NOTE — Progress Notes (Signed)
Patient arrived from PACU on a hospital bed, vital signs obtained see flowsheet, placed on tele ccmd notified, patient oriented to room and staff, bed in lowest position, call bell within reach will continue to monitor.
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
## 2017-11-25 NOTE — ED Notes (Signed)
Patient transported to CT 

## 2017-11-25 NOTE — ED Notes (Signed)
Returned from CT.
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
## 2017-11-25 NOTE — Op Note (Signed)
   Date of Surgery: 11/25/2017  INDICATIONS: Ms. Laural BenesJohnson is a 32 y.o.-year-old female who sustained a left femoral shaft fracture; she was indicated for external fixation due to the displaced and unstable nature of the fracture and came to the operating room today for this procedure. Emergency consent was obtained.    PREOPERATIVE DIAGNOSIS:  1. Left comminuted femoral shaft fracture  2.  Complex traumatic laceration to left knee 5 cm  POSTOPERATIVE DIAGNOSIS: Same.  PROCEDURE:  1. External fixation left femur fracture CPT 20690 uniplane 2.  Irrigation and debridement of skin, subcutaneous tissue 6 cm 3.  Adjacent tissue rearrangement left knee 4 cm  SURGEON: N. Glee ArvinMichael Xu, M.D.  ASSIST: Starlyn SkeansMary Lindsey White Cliffs[place]bery, [place]; necessary for the timely completion of procedure and due to complexity of procedure.  ANESTHESIA: general  IV FLUIDS AND URINE: See anesthesia.  ESTIMATED BLOOD LOSS: minimal mL.  IMPLANTS: Zimmer  DRAINS: None.  COMPLICATIONS: None.  DESCRIPTION OF PROCEDURE: The patient was identified in the preoperative holding area.  The operative site was marked by the surgeon and confirmed by the patient.  He was brought back to the operating room.  Anesthesia was induced by the anesthesia team.  A well padded nonsterile tourniquet was placed. The operative extremity was prepped and draped in standard sterile fashion.  A timeout was performed.  Preoperative antibiotics were given.  The bony landmarks were palpated and the pin sites were marked on the skin.  Stab incisions were created.  Blunt dissection was carried through the subcutaneous tissue and through the IT band onto the lateral aspect of the femur.  Fluoroscopy was used to guide placement of the Schanz pins.  Each chance pin was placed by hand after predrilling using fluoroscopic guidance.  Each Schanz pin was placed bicortically with excellent purchase.  The external fixator was then assembled.  The femur was brought out to  length and overall alignment using fluoroscopy.  The clamps were then tightened down.  Compartments remained soft.  We then turned our attention to the complex traumatic laceration on the anterior aspect of her knee.  She demonstrated no joint effusion.  The traumatic skin was excised using a 15 blade back to a healthy bleeding border.  Examination of the wound demonstrated no intra-articular penetration.  The wound was subcutaneous.  I then performed sharp excisional debridement of the skin, subcutaneous tissue with a rondure.  This was then thoroughly irrigated with 3 L normal saline.  I then made a 3 cm back cut in order to perform adjacent tissue rearrangement to close the traumatic wound with interrupted 3-0 nylon sutures.  The pin sites were then thoroughly irrigated.  Sterile dressings were applied.  Patient tolerated procedure well had no major complications.  POSTOPERATIVE PLAN: The patient will need definitive fixation of her left acetabular and femoral shaft fracture once she has been fully resuscitated.  Mayra ReelN. Michael Xu, MD [facility] [phone] [phone] 10:41 AM

## 2017-11-25 NOTE — Progress Notes (Signed)
Pt to remain on ICU bed at this time due to external fixators and chest tube

## 2017-11-25 NOTE — Transfer of Care (Signed)
Immediate Anesthesia Transfer of Care Note  Patient: Kristen Cook  Procedure(s) Performed: REPAIR OF CARDIAC INJURY, pericardial window (N/A Chest) EXTERNAL FIXATION LEG (Left Leg Lower)  Patient Location: PACU  Anesthesia Type:General  Level of Consciousness: sedated  Airway & Oxygen Therapy: Patient Spontanous Breathing and Patient connected to nasal cannula oxygen  Post-op Assessment: Report given to RN and Post -op Vital signs reviewed and stable  Post vital signs: Reviewed and stable  Last Vitals:  Vitals Value Taken Time  BP 127/94 11/25/2017 11:25 AM  Temp 36.4 C 11/25/2017 11:25 AM  Pulse 95 11/25/2017 11:30 AM  Resp 13 11/25/2017 11:30 AM  SpO2 100 % 11/25/2017 11:30 AM  Vitals shown include unvalidated device data.  Last Pain:  Vitals:   11/25/17 0740  TempSrc: Temporal  PainSc:          Complications: No apparent anesthesia complications

## 2017-11-25 NOTE — ED Provider Notes (Signed)
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 echo ordered and echo tech will come in as well She remains neurovascular intact in legs She is now hypotensive, will order blood products 7:09 AM Dr Dorris Fetch in to see patient She will go to OR  Final Clinical Impressions(s) / ED Diagnoses   Final diagnoses:  Hemopericardium  Contusion of both lungs, initial encounter  Closed displaced comminuted fracture of shaft of left femur, initial encounter [facility])  Hemoperitoneum    ED Discharge Orders    None       Zadie Rhine, MD 11/25/17 [phone]

## 2017-11-25 NOTE — Progress Notes (Addendum)
  Echocardiogram Echocardiogram Transesophageal has been performed.  Kristen SavoyCasey N Keoni Cook 11/25/2017, 9:33 AM

## 2017-11-26 ENCOUNTER — Inpatient Hospital Stay (HOSPITAL_COMMUNITY): Payer: 59

## 2017-11-26 ENCOUNTER — Encounter (HOSPITAL_COMMUNITY): Payer: Self-pay | Admitting: Orthopedic Surgery

## 2017-11-26 LAB — CBC WITH DIFFERENTIAL/PLATELET
BASOS ABS: 0 10*3/uL (ref 0.0–0.1)
Basophils Relative: 0 %
EOS ABS: 0 10*3/uL (ref 0.0–0.7)
EOS PCT: 0 %
HCT: 27.2 % — ABNORMAL LOW (ref 36.0–46.0)
HEMOGLOBIN: 8.8 g/dL — AB (ref 12.0–15.0)
Lymphocytes Relative: 8 %
Lymphs Abs: 1.2 10*3/uL (ref 0.7–4.0)
MCH: 27.6 pg (ref 26.0–34.0)
MCHC: 32.4 g/dL (ref 30.0–36.0)
MCV: 85.3 fL (ref 78.0–100.0)
Monocytes Absolute: 1.4 10*3/uL — ABNORMAL HIGH (ref 0.1–1.0)
Monocytes Relative: 9 %
NEUTROS PCT: 83 %
Neutro Abs: 13.2 10*3/uL — ABNORMAL HIGH (ref 1.7–7.7)
PLATELETS: 138 10*3/uL — AB (ref 150–400)
RBC: 3.19 MIL/uL — AB (ref 3.87–5.11)
RDW: 13.8 % (ref 11.5–15.5)
WBC: 15.8 10*3/uL — AB (ref 4.0–10.5)

## 2017-11-26 LAB — POCT I-STAT 7, (LYTES, BLD GAS, ICA,H+H)
ACID-BASE DEFICIT: 7 mmol/L — AB (ref 0.0–2.0)
Acid-base deficit: 9 mmol/L — ABNORMAL HIGH (ref 0.0–2.0)
Bicarbonate: 18.3 mmol/L — ABNORMAL LOW (ref 20.0–28.0)
Bicarbonate: 19.5 mmol/L — ABNORMAL LOW (ref 20.0–28.0)
Calcium, Ion: 1.07 mmol/L — ABNORMAL LOW (ref 1.15–1.40)
Calcium, Ion: 1.06 mmol/L — ABNORMAL LOW (ref 1.15–1.40)
HEMATOCRIT: 26 % — AB (ref 36.0–46.0)
HEMATOCRIT: 25 % — AB (ref 36.0–46.0)
Hemoglobin: 8.8 g/dL — ABNORMAL LOW (ref 12.0–15.0)
Hemoglobin: 8.5 g/dL — ABNORMAL LOW (ref 12.0–15.0)
O2 SAT: 100 %
O2 Saturation: 100 %
PCO2 ART: 35.8 mmHg (ref 32.0–48.0)
PO2 ART: 461 mmHg — AB (ref 83.0–108.0)
POTASSIUM: 3.9 mmol/L (ref 3.5–5.1)
POTASSIUM: 4.1 mmol/L (ref 3.5–5.1)
Patient temperature: 35.2
Sodium: 144 mmol/L (ref 135–145)
Sodium: 143 mmol/L (ref 135–145)
TCO2: 20 mmol/L — AB (ref 22–32)
TCO2: 21 mmol/L — ABNORMAL LOW (ref 22–32)
pCO2 arterial: 43.7 mmHg (ref 32.0–48.0)
pH, Arterial: 7.219 — ABNORMAL LOW (ref 7.350–7.450)
pH, Arterial: 7.33 — ABNORMAL LOW (ref 7.350–7.450)
pO2, Arterial: 482 mmHg — ABNORMAL HIGH (ref 83.0–108.0)

## 2017-11-26 LAB — BASIC METABOLIC PANEL
Anion gap: 10 (ref 5–15)
BUN: 8 mg/dL (ref 6–20)
CALCIUM: 7.4 mg/dL — AB (ref 8.9–10.3)
CO2: 20 mmol/L — ABNORMAL LOW (ref 22–32)
CREATININE: 0.9 mg/dL (ref 0.44–1.00)
Chloride: 107 mmol/L (ref 101–111)
GFR calc non Af Amer: >60 mL/min (ref >60)
Glucose, Bld: 126 mg/dL — ABNORMAL HIGH (ref 65–99)
Potassium: 4.4 mmol/L (ref 3.5–5.1)
SODIUM: 137 mmol/L (ref 135–145)

## 2017-11-26 LAB — POCT I-STAT 4, (NA,K, GLUC, HGB,HCT)
GLUCOSE: 181 mg/dL — AB (ref 65–99)
HEMATOCRIT: 30 % — AB (ref 36.0–46.0)
Hemoglobin: 10.2 g/dL — ABNORMAL LOW (ref 12.0–15.0)
POTASSIUM: 3.7 mmol/L (ref 3.5–5.1)
SODIUM: 144 mmol/L (ref 135–145)

## 2017-11-26 LAB — LACTIC ACID, PLASMA: Lactic Acid, Venous: 2.1 mmol/L (ref 0.5–1.9)

## 2017-11-26 LAB — PREPARE RBC (CROSSMATCH)

## 2017-11-26 MED ORDER — SODIUM CHLORIDE 0.9 % IV SOLN: Freq: Once | INTRAVENOUS | Status: DC

## 2017-11-26 NOTE — Progress Notes (Signed)
      [address]       [place], [id number]             [phone]      Sleeping when I came into room. Some pain  BP 112/82   Pulse (!) 129   Temp 100.1 F (37.8 C) (Oral)   Resp 18   Ht 5\' 5"  (1.651 m)   Wt 130 lb (59 kg)   LMP  (LMP Unknown)   SpO2 96%   BMI 21.63 kg/m   Has drained about 300 ml total from pericardial drain- will keep in place for now Drainage is serous, not bloody  Viviann SpareSteven C. Dorris FetchHendrickson, MD [facility] [phone]

## 2017-11-26 NOTE — Plan of Care (Signed)
  Problem: Education: Goal: Knowledge of General Education information will improve Outcome: Progressing   
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
## 2017-11-26 NOTE — Progress Notes (Signed)
MD notified of patient's temp 100.1. Advised to give tylenol and proceed with transfusion.   Ernestina ColumbiaK. Starr Sudeep Scheibel, RN

## 2017-11-27 ENCOUNTER — Inpatient Hospital Stay (HOSPITAL_COMMUNITY): Payer: 59

## 2017-11-27 ENCOUNTER — Encounter (HOSPITAL_COMMUNITY): Payer: Self-pay | Admitting: Thoracic Surgery (Cardiothoracic Vascular Surgery)

## 2017-11-27 DIAGNOSIS — S32810A Multiple fractures of pelvis with stable disruption of pelvic ring, initial encounter for closed fracture: Secondary | ICD-10-CM

## 2017-11-27 DIAGNOSIS — S32462A Displaced associated transverse-posterior fracture of left acetabulum, initial encounter for closed fracture: Secondary | ICD-10-CM

## 2017-11-27 HISTORY — DX: Displaced associated transverse-posterior fracture of left acetabulum, initial encounter for closed fracture: S32.462A

## 2017-11-27 HISTORY — DX: Multiple fractures of pelvis with stable disruption of pelvic ring, initial encounter for closed fracture: S32.810A

## 2017-11-27 LAB — CBC
HEMATOCRIT: 32.2 % — AB (ref 36.0–46.0)
Hemoglobin: 10.8 g/dL — ABNORMAL LOW (ref 12.0–15.0)
MCH: 28.6 pg (ref 26.0–34.0)
MCHC: 33.5 g/dL (ref 30.0–36.0)
MCV: 85.2 fL (ref 78.0–100.0)
Platelets: 102 10*3/uL — ABNORMAL LOW (ref 150–400)
RBC: 3.78 MIL/uL — ABNORMAL LOW (ref 3.87–5.11)
RDW: 13.7 % (ref 11.5–15.5)
WBC: 18.7 10*3/uL — ABNORMAL HIGH (ref 4.0–10.5)

## 2017-11-27 LAB — BASIC METABOLIC PANEL
Anion gap: 7 (ref 5–15)
BUN: 7 mg/dL (ref 6–20)
CO2: 24 mmol/L (ref 22–32)
CREATININE: 0.79 mg/dL (ref 0.44–1.00)
Calcium: 8 mg/dL — ABNORMAL LOW (ref 8.9–10.3)
Chloride: 106 mmol/L (ref 101–111)
GFR calc Af Amer: >60 mL/min (ref >60)
GFR calc non Af Amer: >60 mL/min (ref >60)
GLUCOSE: 102 mg/dL — AB (ref 65–99)
Potassium: 3.8 mmol/L (ref 3.5–5.1)
Sodium: 137 mmol/L (ref 135–145)

## 2017-11-27 LAB — PREPARE RBC (CROSSMATCH)

## 2017-11-27 MED ORDER — SODIUM CHLORIDE 0.9 % IV SOLN: Freq: Once | INTRAVENOUS | Status: DC

## 2017-11-27 MED ORDER — CEFAZOLIN SODIUM-DEXTROSE 1-4 GM/50ML-% IV SOLN
1.0000 g | INTRAVENOUS | Status: AC
  Administered 2017-11-28 (×2): 2 g via INTRAVENOUS
  Filled 2017-11-27: qty 50

## 2017-11-27 MED ORDER — SODIUM CHLORIDE 0.9% FLUSH: 10.0000 mL | INTRAVENOUS | Status: DC | PRN

## 2017-11-27 MED ORDER — METHOCARBAMOL 500 MG PO TABS: 500.0000 mg | ORAL_TABLET | Freq: Three times a day (TID) | ORAL | Status: DC | PRN
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
## 2017-11-27 NOTE — Plan of Care (Signed)
Care plan reviewed, patient is progressing.  
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
## 2017-11-27 NOTE — Progress Notes (Signed)
Chest tube removed from right anterior chest without difficulty.  Insertion site closed with existing suture.  Patient tolerated well.  Explanation given to patient and family members prior to task.

## 2017-11-27 NOTE — Progress Notes (Signed)
Pin care given.  Cleaned with 1/2 saline and hydrogen peroxide.  Minimal drainage noted.  Patient tolerated well.-
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
## 2017-11-28 ENCOUNTER — Inpatient Hospital Stay (HOSPITAL_COMMUNITY): Payer: 59

## 2017-11-28 ENCOUNTER — Inpatient Hospital Stay (HOSPITAL_COMMUNITY): Payer: 59 | Admitting: Anesthesiology

## 2017-11-28 ENCOUNTER — Encounter (HOSPITAL_COMMUNITY): Payer: Self-pay | Admitting: Certified Registered Nurse Anesthetist

## 2017-11-28 ENCOUNTER — Encounter (HOSPITAL_COMMUNITY): Admission: EM | Disposition: A | Discharge status: Home / Self Care | Payer: Self-pay | Source: Home / Self Care

## 2017-11-28 HISTORY — PX: EXTERNAL FIXATION REMOVAL: SHX5040

## 2017-11-28 HISTORY — PX: FEMUR IM NAIL: SHX1597

## 2017-11-28 HISTORY — PX: ORIF ACETABULAR FRACTURE: SHX5029

## 2017-11-28 LAB — BASIC METABOLIC PANEL
Anion gap: 11 (ref 5–15)
BUN: 11 mg/dL (ref 6–20)
CALCIUM: 7.8 mg/dL — AB (ref 8.9–10.3)
CO2: 22 mmol/L (ref 22–32)
CREATININE: 0.77 mg/dL (ref 0.44–1.00)
Chloride: 106 mmol/L (ref 101–111)
GFR calc Af Amer: >60 mL/min (ref >60)
GLUCOSE: 94 mg/dL (ref 65–99)
Potassium: 3.4 mmol/L — ABNORMAL LOW (ref 3.5–5.1)
Sodium: 139 mmol/L (ref 135–145)

## 2017-11-28 LAB — POCT I-STAT 7, (LYTES, BLD GAS, ICA,H+H)
Acid-base deficit: 3 mmol/L — ABNORMAL HIGH (ref 0.0–2.0)
Bicarbonate: 22.3 mmol/L (ref 20.0–28.0)
CALCIUM ION: 1.06 mmol/L — AB (ref 1.15–1.40)
HEMATOCRIT: 29 % — AB (ref 36.0–46.0)
Hemoglobin: 9.9 g/dL — ABNORMAL LOW (ref 12.0–15.0)
O2 SAT: 90 %
PH ART: 7.379 (ref 7.350–7.450)
POTASSIUM: 3.9 mmol/L (ref 3.5–5.1)
Sodium: 143 mmol/L (ref 135–145)
TCO2: 23 mmol/L (ref 22–32)
pCO2 arterial: 37.8 mmHg (ref 32.0–48.0)
pO2, Arterial: 60 mmHg — ABNORMAL LOW (ref 83.0–108.0)

## 2017-11-28 LAB — PROTIME-INR
INR: 1.18
Prothrombin Time: 14.9 seconds (ref 11.4–15.2)

## 2017-11-28 LAB — APTT: aPTT: 29 seconds (ref 24–36)

## 2017-11-28 LAB — CBC
HEMATOCRIT: 31 % — AB (ref 36.0–46.0)
Hemoglobin: 10.3 g/dL — ABNORMAL LOW (ref 12.0–15.0)
MCH: 28.9 pg (ref 26.0–34.0)
MCHC: 33.2 g/dL (ref 30.0–36.0)
MCV: 86.8 fL (ref 78.0–100.0)
PLATELETS: 123 10*3/uL — AB (ref 150–400)
RBC: 3.57 MIL/uL — ABNORMAL LOW (ref 3.87–5.11)
RDW: 14.3 % (ref 11.5–15.5)
WBC: 16.4 10*3/uL — AB (ref 4.0–10.5)

## 2017-11-28 LAB — MRSA PCR SCREENING: MRSA by PCR: NEGATIVE

## 2017-11-28 LAB — LACTIC ACID, PLASMA: Lactic Acid, Venous: 1.4 mmol/L (ref 0.5–1.9)

## 2017-11-28 SURGERY — INSERTION, INTRAMEDULLARY ROD, FEMUR
Anesthesia: General | Site: Leg Upper | Laterality: Left

## 2017-11-28 MED ORDER — TOBRAMYCIN SULFATE 1.2 G IJ SOLR
INTRAMUSCULAR | Status: AC
  Filled 2017-11-28: qty 1.2

## 2017-11-28 MED ORDER — TRANEXAMIC ACID 1000 MG/10ML IV SOLN
1000.0000 mg | INTRAVENOUS | Status: AC
  Administered 2017-11-28: 1000 mg via INTRAVENOUS
  Filled 2017-11-28: qty 10

## 2017-11-28 MED ORDER — ONDANSETRON HCL 4 MG/2ML IJ SOLN
INTRAMUSCULAR | Status: AC
  Filled 2017-11-28: qty 2

## 2017-11-28 MED ORDER — FENTANYL CITRATE (PF) 250 MCG/5ML IJ SOLN
INTRAMUSCULAR | Status: AC
Start: 2017-11-28 — End: 2017-11-28
  Filled 2017-11-28: qty 5

## 2017-11-28 MED ORDER — 0.9 % SODIUM CHLORIDE (POUR BTL) OPTIME
TOPICAL | Status: DC | PRN
  Administered 2017-11-28: 1000 mL

## 2017-11-28 MED ORDER — DEXAMETHASONE SODIUM PHOSPHATE 10 MG/ML IJ SOLN
INTRAMUSCULAR | Status: AC
  Filled 2017-11-28: qty 1

## 2017-11-28 MED ORDER — MIDAZOLAM HCL 2 MG/2ML IJ SOLN
INTRAMUSCULAR | Status: DC | PRN
  Administered 2017-11-28: 1 mg via INTRAVENOUS

## 2017-11-28 MED ORDER — FENTANYL CITRATE (PF) 100 MCG/2ML IJ SOLN
INTRAMUSCULAR | Status: AC
  Administered 2017-11-28: 50 ug via INTRAVENOUS
  Filled 2017-11-28: qty 2

## 2017-11-28 MED ORDER — OXYCODONE HCL 5 MG PO TABS: 5.0000 mg | ORAL_TABLET | Freq: Once | ORAL | Status: DC | PRN

## 2017-11-28 MED ORDER — LIDOCAINE 2% (20 MG/ML) 5 ML SYRINGE
INTRAMUSCULAR | Status: AC
  Filled 2017-11-28: qty 5

## 2017-11-28 MED ORDER — FENTANYL CITRATE (PF) 250 MCG/5ML IJ SOLN
INTRAMUSCULAR | Status: AC
  Filled 2017-11-28: qty 5

## 2017-11-28 MED ORDER — HYDROMORPHONE HCL 1 MG/ML IJ SOLN
INTRAMUSCULAR | Status: DC | PRN
  Administered 2017-11-28: 0.5 mg via INTRAVENOUS

## 2017-11-28 MED ORDER — ROCURONIUM BROMIDE 10 MG/ML (PF) SYRINGE
PREFILLED_SYRINGE | INTRAVENOUS | Status: AC
  Filled 2017-11-28: qty 5

## 2017-11-28 MED ORDER — TRANEXAMIC ACID 1000 MG/10ML IV SOLN
1000.0000 mg | INTRAVENOUS | Status: DC
  Filled 2017-11-28: qty 10

## 2017-11-28 MED ORDER — PHENYLEPHRINE 40 MCG/ML (10ML) SYRINGE FOR IV PUSH (FOR BLOOD PRESSURE SUPPORT)
PREFILLED_SYRINGE | INTRAVENOUS | Status: AC
  Filled 2017-11-28: qty 10

## 2017-11-28 MED ORDER — MIDAZOLAM HCL 2 MG/2ML IJ SOLN
INTRAMUSCULAR | Status: AC
  Filled 2017-11-28: qty 2

## 2017-11-28 MED ORDER — DEXAMETHASONE SODIUM PHOSPHATE 10 MG/ML IJ SOLN
INTRAMUSCULAR | Status: DC | PRN
  Administered 2017-11-28: 10 mg via INTRAVENOUS

## 2017-11-28 MED ORDER — PROPOFOL 10 MG/ML IV BOLUS
INTRAVENOUS | Status: DC | PRN
  Administered 2017-11-28: 40 mg via INTRAVENOUS
  Administered 2017-11-28: 50 mg via INTRAVENOUS

## 2017-11-28 MED ORDER — HYDROMORPHONE HCL 1 MG/ML IJ SOLN
INTRAMUSCULAR | Status: AC
  Filled 2017-11-28: qty 0.5

## 2017-11-28 MED ORDER — ALBUMIN HUMAN 5 % IV SOLN
INTRAVENOUS | Status: DC | PRN
  Administered 2017-11-28: 12:00:00 via INTRAVENOUS

## 2017-11-28 MED ORDER — ONDANSETRON HCL 4 MG/2ML IJ SOLN
INTRAMUSCULAR | Status: DC | PRN
  Administered 2017-11-28: 4 mg via INTRAVENOUS

## 2017-11-28 MED ORDER — VANCOMYCIN HCL 1000 MG IV SOLR
INTRAVENOUS | Status: AC
  Filled 2017-11-28: qty 2000

## 2017-11-28 MED ORDER — CEFAZOLIN SODIUM 1 G IJ SOLR
INTRAMUSCULAR | Status: AC
  Filled 2017-11-28: qty 20

## 2017-11-28 MED ORDER — SODIUM CHLORIDE 0.9 % IR SOLN
Status: DC | PRN
  Administered 2017-11-28: 3000 mL

## 2017-11-28 MED ORDER — LIDOCAINE HCL (CARDIAC) 20 MG/ML IV SOLN
INTRAVENOUS | Status: DC | PRN
  Administered 2017-11-28: 60 mg via INTRAVENOUS

## 2017-11-28 MED ORDER — PROPOFOL 10 MG/ML IV BOLUS
INTRAVENOUS | Status: AC
  Filled 2017-11-28: qty 20

## 2017-11-28 MED ORDER — PHENYLEPHRINE HCL 10 MG/ML IJ SOLN
INTRAMUSCULAR | Status: DC | PRN
  Administered 2017-11-28: 80 ug via INTRAVENOUS
  Administered 2017-11-28 (×4): 40 ug via INTRAVENOUS
  Administered 2017-11-28: 80 ug via INTRAVENOUS
  Administered 2017-11-28: 40 ug via INTRAVENOUS

## 2017-11-28 MED ORDER — SUGAMMADEX SODIUM 200 MG/2ML IV SOLN
INTRAVENOUS | Status: AC
  Filled 2017-11-28: qty 2

## 2017-11-28 MED ORDER — SCOPOLAMINE 1 MG/3DAYS TD PT72
MEDICATED_PATCH | TRANSDERMAL | Status: DC | PRN
  Administered 2017-11-28: 1 via TRANSDERMAL

## 2017-11-28 MED ORDER — FENTANYL CITRATE (PF) 250 MCG/5ML IJ SOLN
INTRAMUSCULAR | Status: DC | PRN
  Administered 2017-11-28: 50 ug via INTRAVENOUS
  Administered 2017-11-28: 100 ug via INTRAVENOUS
  Administered 2017-11-28 (×4): 50 ug via INTRAVENOUS
  Administered 2017-11-28: 100 ug via INTRAVENOUS
  Administered 2017-11-28: 50 ug via INTRAVENOUS

## 2017-11-28 MED ORDER — VANCOMYCIN HCL 1000 MG IV SOLR
INTRAVENOUS | Status: DC | PRN
  Administered 2017-11-28: 1000 mg

## 2017-11-28 MED ORDER — OXYCODONE HCL 5 MG/5ML PO SOLN: 5.0000 mg | Freq: Once | ORAL | Status: DC | PRN

## 2017-11-28 MED ORDER — ROCURONIUM BROMIDE 100 MG/10ML IV SOLN
INTRAVENOUS | Status: DC | PRN
  Administered 2017-11-28: 20 mg via INTRAVENOUS
  Administered 2017-11-28: 10 mg via INTRAVENOUS
  Administered 2017-11-28: 50 mg via INTRAVENOUS
  Administered 2017-11-28: 20 mg via INTRAVENOUS
  Administered 2017-11-28: 30 mg via INTRAVENOUS
  Administered 2017-11-28: 20 mg via INTRAVENOUS

## 2017-11-28 MED ORDER — TOBRAMYCIN SULFATE 1.2 G IJ SOLR
INTRAMUSCULAR | Status: DC | PRN
  Administered 2017-11-28: 1.2 g

## 2017-11-28 MED ORDER — CEFAZOLIN SODIUM-DEXTROSE 2-4 GM/100ML-% IV SOLN
2.0000 g | Freq: Three times a day (TID) | INTRAVENOUS | Status: AC
  Administered 2017-11-29 (×3): 2 g via INTRAVENOUS
  Filled 2017-11-28 (×5): qty 100

## 2017-11-28 MED ORDER — SUGAMMADEX SODIUM 200 MG/2ML IV SOLN
INTRAVENOUS | Status: DC | PRN
  Administered 2017-11-28: 120 mg via INTRAVENOUS

## 2017-11-28 MED ORDER — BACITRACIN ZINC 500 UNIT/GM EX OINT
TOPICAL_OINTMENT | CUTANEOUS | Status: AC
  Filled 2017-11-28: qty 28.35

## 2017-11-28 MED ORDER — BACITRACIN ZINC 500 UNIT/GM EX OINT
TOPICAL_OINTMENT | CUTANEOUS | Status: DC | PRN
  Administered 2017-11-28: 1 via TOPICAL

## 2017-11-28 MED ORDER — PROMETHAZINE HCL 25 MG/ML IJ SOLN: 6.2500 mg | INTRAMUSCULAR | Status: DC | PRN

## 2017-11-28 MED ORDER — SCOPOLAMINE 1 MG/3DAYS TD PT72
MEDICATED_PATCH | TRANSDERMAL | Status: AC
  Filled 2017-11-28: qty 1

## 2017-11-28 MED ORDER — FENTANYL CITRATE (PF) 100 MCG/2ML IJ SOLN
25.0000 ug | INTRAMUSCULAR | Status: DC | PRN
  Administered 2017-11-28: 50 ug via INTRAVENOUS

## 2017-11-28 SURGICAL SUPPLY — 118 items
BANDAGE ACE 4X5 VEL STRL LF (GAUZE/BANDAGES/DRESSINGS) ×4 IMPLANT
BANDAGE ACE 6X5 VEL STRL LF (GAUZE/BANDAGES/DRESSINGS) ×4 IMPLANT
BANDAGE ELASTIC 6 VELCRO ST LF (GAUZE/BANDAGES/DRESSINGS) ×4 IMPLANT
BIT DRILL 2.5X110 QC LCP DISP (BIT) ×2 IMPLANT
BIT DRILL 2.5X300 (BIT) IMPLANT
BIT DRILL 4.2 (DRILL) IMPLANT
BIT DRILL FLUTED FEMUR 4.2/3 (BIT) ×2 IMPLANT
BIT DRILL QC 3.5X195 (BIT) ×2 IMPLANT
BIT DRILL STEP 3.5 (DRILL) IMPLANT
BLADE SURG 10 STRL SS (BLADE) ×8 IMPLANT
BNDG COHESIVE 4X5 TAN STRL (GAUZE/BANDAGES/DRESSINGS) ×4 IMPLANT
BNDG COHESIVE 6X5 TAN STRL LF (GAUZE/BANDAGES/DRESSINGS) IMPLANT
BNDG ELASTIC 6X15 VLCR STRL LF (GAUZE/BANDAGES/DRESSINGS) ×2 IMPLANT
BNDG GAUZE ELAST 4 BULKY (GAUZE/BANDAGES/DRESSINGS) ×6 IMPLANT
BRUSH SCRUB SURG 4.25 DISP (MISCELLANEOUS) ×8 IMPLANT
CHLORAPREP W/TINT 26ML (MISCELLANEOUS) ×6 IMPLANT
CLOSURE WOUND 1/2 X4 (GAUZE/BANDAGES/DRESSINGS) ×1
CONNECTOR 5 IN 1 STRAIGHT STRL (MISCELLANEOUS) ×2 IMPLANT
COVER MAYO STAND STRL (DRAPES) ×2 IMPLANT
COVER SURGICAL LIGHT HANDLE (MISCELLANEOUS) ×6 IMPLANT
DRAPE C-ARM 35X43 STRL (DRAPES) ×4 IMPLANT
DRAPE C-ARM 42X72 X-RAY (DRAPES) ×4 IMPLANT
DRAPE C-ARMOR (DRAPES) ×4 IMPLANT
DRAPE HALF SHEET 40X57 (DRAPES) ×10 IMPLANT
DRAPE IMP U-DRAPE 54X76 (DRAPES) ×8 IMPLANT
DRAPE INCISE IOBAN 66X45 STRL (DRAPES) ×4 IMPLANT
DRAPE INCISE IOBAN 85X60 (DRAPES) ×6 IMPLANT
DRAPE ORTHO SPLIT 77X108 STRL (DRAPES) ×4
DRAPE SURG 17X23 STRL (DRAPES) ×4 IMPLANT
DRAPE SURG ORHT 6 SPLT 77X108 (DRAPES) ×4 IMPLANT
DRAPE U-SHAPE 47X51 STRL (DRAPES) ×4 IMPLANT
DRILL 4.2 (DRILL) ×8
DRILL BIT 2.5X300 (BIT) ×2
DRILL STEP 3.5 (DRILL)
DRSG ADAPTIC 3X8 NADH LF (GAUZE/BANDAGES/DRESSINGS) ×4 IMPLANT
DRSG MEPILEX BORDER 4X12 (GAUZE/BANDAGES/DRESSINGS) ×2 IMPLANT
DRSG MEPILEX BORDER 4X4 (GAUZE/BANDAGES/DRESSINGS) ×14 IMPLANT
DRSG MEPILEX BORDER 4X8 (GAUZE/BANDAGES/DRESSINGS) ×4 IMPLANT
ELECT BLADE 6.5 EXT (BLADE) ×4 IMPLANT
ELECT REM PT RETURN 9FT ADLT (ELECTROSURGICAL) ×4
ELECTRODE REM PT RTRN 9FT ADLT (ELECTROSURGICAL) ×2 IMPLANT
GAUZE SPONGE 4X4 12PLY STRL (GAUZE/BANDAGES/DRESSINGS) ×4 IMPLANT
GLOVE BIO SURGEON STRL SZ7.5 (GLOVE) ×14 IMPLANT
GLOVE BIOGEL PI IND STRL 7.5 (GLOVE) ×2 IMPLANT
GLOVE BIOGEL PI INDICATOR 7.5 (GLOVE) ×4
GLOVE SURG SS PI 6.5 STRL IVOR (GLOVE) ×2 IMPLANT
GOWN STRL REUS W/ TWL LRG LVL3 (GOWN DISPOSABLE) ×6 IMPLANT
GOWN STRL REUS W/TWL LRG LVL3 (GOWN DISPOSABLE) ×6
GOWN STRL REUS W/TWL XL LVL3 (GOWN DISPOSABLE) ×4 IMPLANT
GUIDEWIRE 3.2X400 (WIRE) ×2 IMPLANT
HANDPIECE INTERPULSE COAX TIP (DISPOSABLE) ×2
KIT BASIN OR (CUSTOM PROCEDURE TRAY) ×4 IMPLANT
KIT TURNOVER KIT B (KITS) ×4 IMPLANT
MANIFOLD NEPTUNE II (INSTRUMENTS) ×4 IMPLANT
MARKER SKIN DUAL TIP RULER LAB (MISCELLANEOUS) ×2 IMPLANT
NAIL CANN FRN 11X420MM LEFT (Nail) ×2 IMPLANT
NS IRRIG 1000ML POUR BTL (IV SOLUTION) ×4 IMPLANT
PACK TOTAL JOINT (CUSTOM PROCEDURE TRAY) ×4 IMPLANT
PAD ARMBOARD 7.5X6 YLW CONV (MISCELLANEOUS) ×10 IMPLANT
PADDING CAST COTTON 6X4 STRL (CAST SUPPLIES) ×12 IMPLANT
PLATE BONE 78MM 6HOLE PELVIC (Plate) ×2 IMPLANT
PLATE BONE 91MM 7HOLE PELVIC (Plate) ×2 IMPLANT
PREP POVIDONE IODINE SPRAY 2OZ (MISCELLANEOUS) ×2 IMPLANT
REAMER ROD DEEP FLUTE 2.5X950 (INSTRUMENTS) ×2 IMPLANT
RETRIEVER SUT HEWSON (MISCELLANEOUS) ×4 IMPLANT
SCREW CANN LOCK TI FT 5X42 (Screw) ×2 IMPLANT
SCREW CORTEX 3.5 22MM (Screw) ×2 IMPLANT
SCREW CORTEX 3.5 24MM (Screw) ×2 IMPLANT
SCREW CORTEX 3.5 28MM (Screw) ×2 IMPLANT
SCREW CORTEX 3.5 30MM (Screw) ×2 IMPLANT
SCREW CORTEX 3.5 36MM (Screw) ×4 IMPLANT
SCREW CORTEX 3.5 40MM (Screw) ×4 IMPLANT
SCREW CORTEX 3.5X40MM (Screw) ×6 IMPLANT
SCREW CORTEX 3.5X45MM (Screw) ×2 IMPLANT
SCREW CORTEX 3.5X50MM (Screw) ×4 IMPLANT
SCREW LOCK CORT ST 3.5X22 (Screw) IMPLANT
SCREW LOCK CORT ST 3.5X24 (Screw) IMPLANT
SCREW LOCK CORT ST 3.5X28 (Screw) IMPLANT
SCREW LOCK CORT ST 3.5X30 (Screw) IMPLANT
SCREW LOCK CORT ST 3.5X36 (Screw) IMPLANT
SCREW LOCK CORT ST 3.5X40 (Screw) IMPLANT
SCREW LOCK STAR 5X48 (Screw) ×2 IMPLANT
SCREW LOCK STAR 5X54 (Screw) ×2 IMPLANT
SCREW LOCKING 5.0MMX40MM (Screw) ×2 IMPLANT
SCREW LOCKING 5.0MMX44MM (Screw) ×2 IMPLANT
SCREW LOCKING 5.0X46MM (Screw) ×2 IMPLANT
SCREW LOCKING 5.0X66 (Screw) ×2 IMPLANT
SCREW PELVIC CORT ST 3.5X50 (Screw) IMPLANT
SCREW PELVIC CORT ST 3.5X70 (Screw) IMPLANT
SCREW SELF TAP 3.5 70M (Screw) ×2 IMPLANT
SCREW SHANZ EXFX 6X190 (Screw) ×2 IMPLANT
SET HNDPC FAN SPRY TIP SCT (DISPOSABLE) ×2 IMPLANT
SPONGE LAP 18X18 X RAY DECT (DISPOSABLE) ×4 IMPLANT
STAPLER VISISTAT 35W (STAPLE) ×4 IMPLANT
STOCKINETTE IMPERVIOUS LG (DRAPES) ×4 IMPLANT
STRIP CLOSURE SKIN 1/2X4 (GAUZE/BANDAGES/DRESSINGS) ×3 IMPLANT
SUCTION FRAZIER HANDLE 10FR (MISCELLANEOUS) ×2
SUCTION TUBE FRAZIER 10FR DISP (MISCELLANEOUS) ×2 IMPLANT
SUT ETHILON 2 0 PSLX (SUTURE) ×10 IMPLANT
SUT ETHILON 3 0 PS 1 (SUTURE) ×16 IMPLANT
SUT FIBERWIRE #2 38 T-5 BLUE (SUTURE) ×8
SUT MNCRL AB 3-0 PS2 18 (SUTURE) ×4 IMPLANT
SUT MON AB 2-0 CT1 36 (SUTURE) ×4 IMPLANT
SUT VIC AB 0 CT1 18XCR BRD 8 (SUTURE) IMPLANT
SUT VIC AB 0 CT1 27 (SUTURE) ×2
SUT VIC AB 0 CT1 27XBRD ANBCTR (SUTURE) ×2 IMPLANT
SUT VIC AB 0 CT1 8-18 (SUTURE) ×2
SUT VIC AB 1 CT1 18XCR BRD 8 (SUTURE) ×2 IMPLANT
SUT VIC AB 1 CT1 27 (SUTURE) ×4
SUT VIC AB 1 CT1 27XBRD ANBCTR (SUTURE) ×2 IMPLANT
SUT VIC AB 1 CT1 8-18 (SUTURE) ×2
SUT VIC AB 2-0 CT1 27 (SUTURE) ×4
SUT VIC AB 2-0 CT1 TAPERPNT 27 (SUTURE) ×4 IMPLANT
SUTURE FIBERWR #2 38 T-5 BLUE (SUTURE) ×4 IMPLANT
TOWEL OR 17X24 6PK STRL BLUE (TOWEL DISPOSABLE) ×6 IMPLANT
TOWEL OR 17X26 10 PK STRL BLUE (TOWEL DISPOSABLE) ×6 IMPLANT
UNDERPAD 30X30 (UNDERPADS AND DIAPERS) ×4 IMPLANT
WATER STERILE IRR 1000ML POUR (IV SOLUTION) ×4 IMPLANT

## 2017-11-28 NOTE — Anesthesia Procedure Notes (Signed)
Procedure Name: Intubation Date/Time: 11/28/2017 8:41 AM Performed by: Raenette Rover, CRNA Pre-anesthesia Checklist: Patient identified, Emergency Drugs available, Suction available and Patient being monitored Patient Re-evaluated:Patient Re-evaluated prior to induction Oxygen Delivery Method: Circle system utilized Preoxygenation: Pre-oxygenation with 100% oxygen Induction Type: IV induction Ventilation: Mask ventilation without difficulty Laryngoscope Size: Mac and 3 Grade View: Grade I Tube type: Subglottic suction tube Tube size: 7.0 mm Number of attempts: 1 Airway Equipment and Method: Stylet Placement Confirmation: ETT inserted through vocal cords under direct vision,  positive ETCO2,  CO2 detector and breath sounds checked- equal and bilateral Secured at: 21 cm Tube secured with: Tape Dental Injury: Teeth and Oropharynx as per pre-operative assessment
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
## 2017-11-28 NOTE — Anesthesia Postprocedure Evaluation (Signed)
Anesthesia Post Note  Patient: Kristen LanceJessika Cook  Procedure(s) Performed: INTRAMEDULLARY (IM) NAIL FEMORAL (Left Hip) OPEN REDUCTION INTERNAL FIXATION (ORIF) POSTERIOR WALL ACETABULAR FRACTURE (Left Hip) REMOVAL EXTERNAL FIXATION LEG (Left Leg Upper)     Patient location during evaluation: PACU Anesthesia Type: General Level of consciousness: awake and alert Pain management: pain level controlled Vital Signs Assessment: post-procedure vital signs reviewed and stable Respiratory status: spontaneous breathing, nonlabored ventilation, respiratory function stable and patient connected to face mask oxygen Cardiovascular status: blood pressure returned to baseline and stable Postop Assessment: no apparent nausea or vomiting Anesthetic complications: no Comments: Some hypoxia initially in PACU, required NRBM to maintain SaO2 in low 90's. CXR with atelectasis but otherwise clear, ABG with low PaO2. O2 requirements lessened during PACU stay, now holding SaO2 > 95% with regular face mask.    Last Vitals:  Vitals:   11/28/17 1635 11/28/17 1640  BP:  124/86  Pulse: 90 92  Resp: 11 11  Temp:    SpO2: 97% 97%    Last Pain:  Vitals:   11/28/17 1625  TempSrc:   PainSc: Asleep                 Beryle Lathehomas E Branden Vine
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
## 2017-11-28 NOTE — Transfer of Care (Addendum)
Immediate Anesthesia Transfer of Care Note  Patient: Kristen Cook  Procedure(s) Performed: INTRAMEDULLARY (IM) NAIL FEMORAL (Left Hip) OPEN REDUCTION INTERNAL FIXATION (ORIF) POSTERIOR WALL ACETABULAR FRACTURE (Left Hip) REMOVAL EXTERNAL FIXATION LEG (Left Leg Upper)  Patient Location: PACU  Anesthesia Type:General  Level of Consciousness: awake, alert , oriented, drowsy and patient cooperative  Airway & Oxygen Therapy: Patient Spontanous Breathing and Patient connected to face mask oxygen  Post-op Assessment: Report given to RN, Post -op Vital signs reviewed and stable and Post -op Vital signs reviewed and unstable, Anesthesiologist notified  Post vital signs: Reviewed, stable and unstable--spo2 in 80s on Face mask; Non-rebreather face mask used with spo2 in low 90s; MDA called to make aware. Will get CXR and ABG. MDA at bedside.  Last Vitals:  Vitals Value Taken Time  BP 128/88 11/28/2017  2:23 PM  Temp    Pulse 99 11/28/2017  2:35 PM  Resp 15 11/28/2017  2:35 PM  SpO2 84 % 11/28/2017  2:35 PM  Vitals shown include unvalidated device data.  Last Pain:  Vitals:   11/28/17 0612  TempSrc:   PainSc: Asleep      Patients Stated Pain Goal: 3 (11/26/17 1800)  Complications: No apparent anesthesia complications

## 2017-11-28 NOTE — Interval H&P Note (Signed)
History and Physical Interval Note:  11/28/2017 7:37 AM  Kristen Cook  has presented today for surgery, with the diagnosis of fractured left femur, acetabulum  The various methods of treatment have been discussed with the patient and family. After consideration of risks, benefits and other options for treatment, the patient has consented to  Procedure(s): INTRAMEDULLARY (IM) NAIL FEMORAL (Left) OPEN REDUCTION INTERNAL FIXATION (ORIF) POSTERIOR WALL ACETABULAR FRACTURE (Left) REMOVAL EXTERNAL FIXATION LEG (Left) as a surgical intervention .  The patient's history has been reviewed, patient examined, no change in status, stable for surgery.  I have reviewed the patient's chart and labs.  Questions were answered to the patient's satisfaction.     Caryn BeeKevin P Haddix

## 2017-11-29 ENCOUNTER — Other Ambulatory Visit: Payer: Self-pay

## 2017-11-29 ENCOUNTER — Encounter (HOSPITAL_COMMUNITY): Payer: Self-pay | Admitting: General Practice

## 2017-11-29 LAB — CBC
HCT: 24.7 % — ABNORMAL LOW (ref 36.0–46.0)
Hemoglobin: 8.3 g/dL — ABNORMAL LOW (ref 12.0–15.0)
MCH: 29.3 pg (ref 26.0–34.0)
MCHC: 33.6 g/dL (ref 30.0–36.0)
MCV: 87.3 fL (ref 78.0–100.0)
PLATELETS: 127 10*3/uL — AB (ref 150–400)
RBC: 2.83 MIL/uL — ABNORMAL LOW (ref 3.87–5.11)
RDW: 14.2 % (ref 11.5–15.5)
WBC: 13 10*3/uL — AB (ref 4.0–10.5)

## 2017-11-29 LAB — TYPE AND SCREEN
ABO/RH(D): B POS
ANTIBODY SCREEN: NEGATIVE
UNIT DIVISION: 0
UNIT DIVISION: 0
UNIT DIVISION: 0
UNIT DIVISION: 0
UNIT DIVISION: 0
UNIT DIVISION: 0
Unit division: 0
Unit division: 0
Unit division: 0
Unit division: 0
Unit division: 0
Unit division: 0
Unit division: 0

## 2017-11-29 LAB — BPAM RBC
BLOOD PRODUCT EXPIRATION DATE: 201904182359
BLOOD PRODUCT EXPIRATION DATE: 201904252359
BLOOD PRODUCT EXPIRATION DATE: 201905032359
BLOOD PRODUCT EXPIRATION DATE: 201905062359
BLOOD PRODUCT EXPIRATION DATE: 201905062359
BLOOD PRODUCT EXPIRATION DATE: 201905072359
BLOOD PRODUCT EXPIRATION DATE: 201905082359
Blood Product Expiration Date: 201904182359
Blood Product Expiration Date: 201904262359
Blood Product Expiration Date: 201904302359
Blood Product Expiration Date: 201905052359
Blood Product Expiration Date: 201905062359
Blood Product Expiration Date: 201905062359
ISSUE DATE / TIME: 201904070713
ISSUE DATE / TIME: 201904070742
ISSUE DATE / TIME: 201904070742
ISSUE DATE / TIME: 201904070742
ISSUE DATE / TIME: 201904080725
ISSUE DATE / TIME: 201904080725
ISSUE DATE / TIME: 201904081434
ISSUE DATE / TIME: 201904081458
ISSUE DATE / TIME: 201904082009
ISSUE DATE / TIME: 201904100855
ISSUE DATE / TIME: 201904100855
UNIT TYPE AND RH: 7300
UNIT TYPE AND RH: 7300
UNIT TYPE AND RH: 7300
UNIT TYPE AND RH: 7300
UNIT TYPE AND RH: 7300
UNIT TYPE AND RH: 7300
Unit Type and Rh: 5100
Unit Type and Rh: 7300
Unit Type and Rh: 7300
Unit Type and Rh: 7300
Unit Type and Rh: 7300
Unit Type and Rh: 9500
Unit Type and Rh: 9500

## 2017-11-29 LAB — BASIC METABOLIC PANEL
Anion gap: 8 (ref 5–15)
BUN: 10 mg/dL (ref 6–20)
CHLORIDE: 107 mmol/L (ref 101–111)
CO2: 24 mmol/L (ref 22–32)
CREATININE: 0.7 mg/dL (ref 0.44–1.00)
Calcium: 7.4 mg/dL — ABNORMAL LOW (ref 8.9–10.3)
GFR calc non Af Amer: >60 mL/min (ref >60)
Glucose, Bld: 219 mg/dL — ABNORMAL HIGH (ref 65–99)
Potassium: 3.8 mmol/L (ref 3.5–5.1)
Sodium: 139 mmol/L (ref 135–145)

## 2017-11-29 MED ORDER — OXYCODONE HCL 5 MG PO TABS
10.0000 mg | ORAL_TABLET | ORAL | Status: DC | PRN
  Administered 2017-12-01 – 2017-12-02 (×3): 10 mg via ORAL
  Filled 2017-11-29 (×4): qty 2

## 2017-11-29 MED ORDER — METHOCARBAMOL 1000 MG/10ML IJ SOLN
1000.0000 mg | Freq: Three times a day (TID) | INTRAVENOUS | Status: DC | PRN
  Administered 2017-12-01 – 2017-12-02 (×4): 1000 mg via INTRAVENOUS
  Filled 2017-11-29 (×4): qty 10

## 2017-11-29 MED ORDER — GUAIFENESIN ER 600 MG PO TB12
600.0000 mg | ORAL_TABLET | Freq: Two times a day (BID) | ORAL | Status: DC
  Administered 2017-11-29: 600 mg via ORAL
  Filled 2017-11-29 (×2): qty 1

## 2017-11-29 MED ORDER — ENOXAPARIN SODIUM 40 MG/0.4ML ~~LOC~~ SOLN
40.0000 mg | SUBCUTANEOUS | Status: DC
  Administered 2017-11-29 – 2017-12-06 (×8): 40 mg via SUBCUTANEOUS
  Filled 2017-11-29 (×8): qty 0.4

## 2017-11-29 MED ORDER — TRAMADOL HCL 50 MG PO TABS
50.0000 mg | ORAL_TABLET | Freq: Four times a day (QID) | ORAL | Status: DC
  Administered 2017-11-29 – 2017-12-06 (×23): 50 mg via ORAL
  Filled 2017-11-29 (×24): qty 1
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
## 2017-11-29 NOTE — Progress Notes (Addendum)
      [address]       [place] [id number]             [phone]      1 Day Post-Op Procedure(s) (LRB): INTRAMEDULLARY (IM) NAIL FEMORAL (Left) OPEN REDUCTION INTERNAL FIXATION (ORIF) POSTERIOR WALL ACETABULAR FRACTURE (Left) REMOVAL EXTERNAL FIXATION LEG (Left) Subjective: Having some leg pain this morning. Otherwise feels okay.   Objective: Vital signs in last 24 hours: Temp:  [97.1 F (36.2 C)-99.1 F (37.3 C)] 98.6 F (37 C) (04/11 0442) Pulse Rate:  [83-107] 83 (04/11 0442) Cardiac Rhythm: Normal sinus rhythm (04/11 0700) Resp:  [0-23] 14 (04/10 2000) BP: (97-137)/(80-97) 118/88 (04/11 0442) SpO2:  [84 %-99 %] 99 % (04/10 2000)     Intake/Output from previous day: 04/10 0701 - 04/11 0700 In: 4478.3 [I.V.:4128.3; IV Piggyback:350] Out: 3060 [Urine:2610; Blood:450] Intake/Output this shift: No intake/output data recorded.  General appearance: alert, cooperative and no distress Heart: regular rate and rhythm, S1, S2 normal, no murmur, click, rub or gallop Lungs: clear to auscultation bilaterally Abdomen: soft, non-tender; bowel sounds normal; no masses,  no organomegaly Extremities: leg leg wrapped with an ACE bandage. good distal pulses. Able to move toes.  Wound: Pericardial incision is c/d/i  Lab Results: Recent Labs    11/28/17 0500 11/28/17 1458 11/29/17 0415  WBC 16.4*  --  13.0*  HGB 10.3* 9.9* 8.3*  HCT 31.0* 29.0* 24.7*  PLT 123*  --  127*   BMET:  Recent Labs    11/28/17 0500 11/28/17 1458 11/29/17 0415  NA 139 143 139  K 3.4* 3.9 3.8  CL 106  --  107  CO2 22  --  24  GLUCOSE 94  --  219*  BUN 11  --  10  CREATININE 0.77  --  0.70  CALCIUM 7.8*  --  7.4*    PT/INR:  Recent Labs    11/28/17 0500  LABPROT 14.9  INR 1.18   ABG    Component Value Date/Time   PHART 7.379 11/28/2017 1458   HCO3 22.3 11/28/2017 1458   TCO2 23 11/28/2017 1458   ACIDBASEDEF 3.0 (H) 11/28/2017 1458   O2SAT 90.0 11/28/2017 1458    CBG (last 3)  No results for input(s): GLUCAP in the last 72 hours.  Assessment/Plan: S/P Procedure(s) (LRB): INTRAMEDULLARY (IM) NAIL FEMORAL (Left) OPEN REDUCTION INTERNAL FIXATION (ORIF) POSTERIOR WALL ACETABULAR FRACTURE (Left) REMOVAL EXTERNAL FIXATION LEG (Left)  1. CV-NSR in the 80s. BP well controlled. Hemodynamically stable 2. Pulm-tolerating room air with good oxygen saturation 3. Renal-creatinine 0.70, electrolytes okay 4. Acute blood loss anemia H and H 8.3/24.7. Care per primary 5. Ortho surgery yesterday on left leg- having pain at the surgical site.   Plan: Remains stable s/p pericardial window. Incisions are healing well.    LOS: 4 days    Kristen Cook 11/29/2017 Looks good Incision healing well CT suture can be removed at time of DC  Viviann SpareSteven C. Dorris FetchHendrickson, MD [facility] [phone]
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
## 2017-11-30 LAB — CBC WITH DIFFERENTIAL/PLATELET
BASOS ABS: 0 10*3/uL (ref 0.0–0.1)
BASOS PCT: 0 %
EOS ABS: 0.5 10*3/uL (ref 0.0–0.7)
EOS PCT: 3 %
HCT: 29.6 % — ABNORMAL LOW (ref 36.0–46.0)
Hemoglobin: 9.7 g/dL — ABNORMAL LOW (ref 12.0–15.0)
Lymphocytes Relative: 14 %
Lymphs Abs: 2.2 10*3/uL (ref 0.7–4.0)
MCH: 28.6 pg (ref 26.0–34.0)
MCHC: 32.8 g/dL (ref 30.0–36.0)
MCV: 87.3 fL (ref 78.0–100.0)
MONO ABS: 1.3 10*3/uL — AB (ref 0.1–1.0)
Monocytes Relative: 8 %
Neutro Abs: 11.6 10*3/uL — ABNORMAL HIGH (ref 1.7–7.7)
Neutrophils Relative %: 75 %
PLATELETS: 208 10*3/uL (ref 150–400)
RBC: 3.39 MIL/uL — AB (ref 3.87–5.11)
RDW: 14.3 % (ref 11.5–15.5)
WBC: 15.5 10*3/uL — AB (ref 4.0–10.5)

## 2017-11-30 MED ORDER — GUAIFENESIN ER 600 MG PO TB12: 1200.0000 mg | ORAL_TABLET | Freq: Two times a day (BID) | ORAL | Status: DC

## 2017-11-30 MED ORDER — GUAIFENESIN ER 600 MG PO TB12
1200.0000 mg | ORAL_TABLET | Freq: Two times a day (BID) | ORAL | Status: DC
  Administered 2017-11-30 – 2017-12-04 (×8): 1200 mg via ORAL
  Filled 2017-11-30 (×9): qty 2

## 2017-11-30 MED ORDER — POLYETHYLENE GLYCOL 3350 17 G PO PACK
17.0000 g | PACK | Freq: Every day | ORAL | Status: DC
  Administered 2017-11-30 – 2017-12-04 (×4): 17 g via ORAL
  Filled 2017-11-30 (×5): qty 1

## 2017-11-30 NOTE — Plan of Care (Signed)
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
## 2017-12-01 LAB — BASIC METABOLIC PANEL
Anion gap: 11 (ref 5–15)
BUN: 10 mg/dL (ref 6–20)
CALCIUM: 8.2 mg/dL — AB (ref 8.9–10.3)
CHLORIDE: 103 mmol/L (ref 101–111)
CO2: 24 mmol/L (ref 22–32)
CREATININE: 0.74 mg/dL (ref 0.44–1.00)
GFR calc non Af Amer: >60 mL/min (ref >60)
Glucose, Bld: 100 mg/dL — ABNORMAL HIGH (ref 65–99)
Potassium: 3 mmol/L — ABNORMAL LOW (ref 3.5–5.1)
SODIUM: 138 mmol/L (ref 135–145)

## 2017-12-01 LAB — CBC
HEMATOCRIT: 29.8 % — AB (ref 36.0–46.0)
HEMOGLOBIN: 9.5 g/dL — AB (ref 12.0–15.0)
MCH: 27.9 pg (ref 26.0–34.0)
MCHC: 31.9 g/dL (ref 30.0–36.0)
MCV: 87.6 fL (ref 78.0–100.0)
Platelets: 248 10*3/uL (ref 150–400)
RBC: 3.4 MIL/uL — ABNORMAL LOW (ref 3.87–5.11)
RDW: 14.4 % (ref 11.5–15.5)
WBC: 16.6 10*3/uL — AB (ref 4.0–10.5)

## 2017-12-01 MED ORDER — POTASSIUM CHLORIDE CRYS ER 20 MEQ PO TBCR
40.0000 meq | EXTENDED_RELEASE_TABLET | Freq: Two times a day (BID) | ORAL | Status: AC
  Administered 2017-12-01 (×2): 40 meq via ORAL
  Filled 2017-12-01 (×2): qty 2
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
## 2017-12-01 NOTE — Plan of Care (Signed)
Care plans reviewed, patient is progressing.  
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
## 2017-12-01 NOTE — Plan of Care (Signed)
  Problem: Education: Goal: Knowledge of General Education information will improve Outcome: Progressing   

## 2017-12-01 NOTE — Progress Notes (Addendum)
      [address]       [place], [id number]             [phone]      3 Days Post-Op Procedure(s) (LRB): INTRAMEDULLARY (IM) NAIL FEMORAL (Left) OPEN REDUCTION INTERNAL FIXATION (ORIF) POSTERIOR WALL ACETABULAR FRACTURE (Left) REMOVAL EXTERNAL FIXATION LEG (Left) Subjective: Feels ok, no specific c/o other than in "pain"  Objective: Vital signs in last 24 hours: Temp:  [97.6 F (36.4 C)-99.1 F (37.3 C)] 98.4 F (36.9 C) (04/13 0422) Pulse Rate:  [87-102] 94 (04/13 0422) Cardiac Rhythm: Normal sinus rhythm (04/13 0700) Resp:  [16-20] 19 (04/13 0422) BP: (111-441)/(71-80) 112/71 (04/13 0422) SpO2:  [96 %-99 %] 96 % (04/13 0422) Weight:  [150 lb 6.4 oz (68.2 kg)-164 lb 14.5 oz (74.8 kg)] 164 lb 14.5 oz (74.8 kg) (04/13 0422)  Hemodynamic parameters for last 24 hours:    Intake/Output from previous day: 04/12 0701 - 04/13 0700 In: 822 [P.O.:822] Out: 225 [Urine:225] Intake/Output this shift: No intake/output data recorded.  General appearance: fatigued and no distress Heart: regular rate and rhythm Lungs: dim in lower fields Abdomen: soft, nontender Extremities: no edema, PAD in place Wound: incis healing well  Lab Results: Recent Labs    11/30/17 1054 12/01/17 0426  WBC 15.5* 16.6*  HGB 9.7* 9.5*  HCT 29.6* 29.8*  PLT 208 248   BMET:  Recent Labs    11/29/17 0415 12/01/17 0426  NA 139 138  K 3.8 3.0*  CL 107 103  CO2 24 24  GLUCOSE 219* 100*  BUN 10 10  CREATININE 0.70 0.74  CALCIUM 7.4* 8.2*    PT/INR: No results for input(s): LABPROT, INR in the last 72 hours. ABG    Component Value Date/Time   PHART 7.379 11/28/2017 1458   HCO3 22.3 11/28/2017 1458   TCO2 23 11/28/2017 1458   ACIDBASEDEF 3.0 (H) 11/28/2017 1458   O2SAT 90.0 11/28/2017 1458   CBG (last 3)  No results for input(s): GLUCAP in the last 72 hours.  Meds Scheduled Meds: . acetaminophen  1,000 mg Oral Q8H  . enoxaparin (LOVENOX) injection  40 mg  Subcutaneous Q24H  . guaiFENesin  1,200 mg Oral BID  . pantoprazole  40 mg Oral Daily  . polyethylene glycol  17 g Oral Daily  . traMADol  50 mg Oral Q6H   Continuous Infusions: . sodium chloride    . sodium chloride    . sodium chloride    . lactated ringers 100 mL/hr at 11/28/17 0542  . methocarbamol (ROBAXIN)  IV     PRN Meds:.HYDROmorphone (DILAUDID) injection, methocarbamol (ROBAXIN)  IV, ondansetron **OR** ondansetron (ZOFRAN) IV, oxyCODONE, sodium chloride flush  Xrays No results found.  Assessment/Plan: S/P Procedure(s) (LRB): INTRAMEDULLARY (IM) NAIL FEMORAL (Left) OPEN REDUCTION INTERNAL FIXATION (ORIF) POSTERIOR WALL ACETABULAR FRACTURE (Left) REMOVAL EXTERNAL FIXATION LEG (Left)  1 stable  From CT surgery perspective, does have some occas tachycardia. K+ is low- will replace.   2 No fevers but leukocytosis is slowly increasing. Has ATX,  needs aggressive pulm toilet/mobilize as able. Recheck CXR. Wean O2 as able.   3 H/H stable  4 cont PT - impproving, orthom restrictions per Dr Jena GaussHaddix  5 other management per ortho/trauma  LOS: 6 days    Rowe ClackWayne E Gold 12/01/2017  Agree with above. Nothing to add from CT surgery perspective. Continue pulmonary toilet and general care per trauma service.

## 2017-12-02 ENCOUNTER — Inpatient Hospital Stay (HOSPITAL_COMMUNITY): Payer: 59

## 2017-12-02 MED ORDER — ACETAMINOPHEN 160 MG/5ML PO SOLN
1000.0000 mg | Freq: Three times a day (TID) | ORAL | Status: DC
  Administered 2017-12-02: 650 mg via ORAL
  Filled 2017-12-02: qty 40.6

## 2017-12-02 MED ORDER — LACTATED RINGERS IV SOLN
INTRAVENOUS | Status: DC
  Administered 2017-12-02 – 2017-12-04 (×4): via INTRAVENOUS

## 2017-12-02 MED ORDER — FENTANYL CITRATE (PF) 100 MCG/2ML IJ SOLN
100.0000 ug | Freq: Once | INTRAMUSCULAR | Status: AC
  Administered 2017-11-25: 100 ug via INTRAVENOUS

## 2017-12-02 NOTE — Progress Notes (Signed)
OT Cancellation Note  Patient Details Name: Kristen Cook MRN: [id number] DOB: 06/03/1986   Cancelled Treatment:    Reason Eval/Treat Not Completed: Other (comment): Pt transferring to new unit this afternoon. Will check back as able.   Doristine Sectionharity A [place] Dodgen, MS OTR/L  Pager: [phone]   Doristine SectionCharity A Llewyn Heap 12/02/2017, 12:24 PM
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
## 2017-12-02 NOTE — Plan of Care (Signed)
Care plan reviewed, patient is progressing.  

## 2017-12-02 NOTE — Plan of Care (Signed)
  Problem: Activity: Goal: Risk for activity intolerance will decrease Outcome: Not Progressing   Problem: Health Behavior/Discharge Planning: Goal: Ability to manage health-related needs will improve Outcome: Not Progressing   Problem: Clinical Measurements: Goal: Respiratory complications will improve Outcome: Progressing
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
## 2017-12-02 NOTE — Progress Notes (Signed)
      [address]       [place] [id number]             [phone]      4 Days Post-Op Procedure(s) (LRB): INTRAMEDULLARY (IM) NAIL FEMORAL (Left) OPEN REDUCTION INTERNAL FIXATION (ORIF) POSTERIOR WALL ACETABULAR FRACTURE (Left) REMOVAL EXTERNAL FIXATION LEG (Left) Subjective: Feels sore  Objective: Vital signs in last 24 hours: Temp:  [98.6 F (37 C)-99.3 F (37.4 C)] 98.9 F (37.2 C) (04/14 0313) Pulse Rate:  [92-108] 108 (04/14 0313) Cardiac Rhythm: Sinus tachycardia (04/14 0700) Resp:  [15-23] 22 (04/14 0313) BP: (106-130)/(75-93) 127/90 (04/14 0313) SpO2:  [93 %-99 %] 98 % (04/14 0313) Weight:  [162 lb (73.5 kg)] 162 lb (73.5 kg) (04/14 0313)  Hemodynamic parameters for last 24 hours:    Intake/Output from previous day: 04/13 0701 - 04/14 0700 In: 480 [P.O.:360; I.V.:120] Out: 50 [Urine:50] Intake/Output this shift: No intake/output data recorded.  General appearance: distracted and flushed Heart: regular rate and rhythm and tachy Lungs: dim in lower fields Abdomen: soft, non-tender Extremities: no edema Wound: incis healing well  Lab Results: Recent Labs    11/30/17 1054 12/01/17 0426  WBC 15.5* 16.6*  HGB 9.7* 9.5*  HCT 29.6* 29.8*  PLT 208 248   BMET:  Recent Labs    12/01/17 0426  NA 138  K 3.0*  CL 103  CO2 24  GLUCOSE 100*  BUN 10  CREATININE 0.74  CALCIUM 8.2*    PT/INR: No results for input(s): LABPROT, INR in the last 72 hours. ABG    Component Value Date/Time   PHART 7.379 11/28/2017 1458   HCO3 22.3 11/28/2017 1458   TCO2 23 11/28/2017 1458   ACIDBASEDEF 3.0 (H) 11/28/2017 1458   O2SAT 90.0 11/28/2017 1458   CBG (last 3)  No results for input(s): GLUCAP in the last 72 hours.  Meds Scheduled Meds: . acetaminophen  1,000 mg Oral Q8H  . enoxaparin (LOVENOX) injection  40 mg Subcutaneous Q24H  . guaiFENesin  1,200 mg Oral BID  . pantoprazole  40 mg Oral Daily  . polyethylene glycol  17 g Oral Daily    . traMADol  50 mg Oral Q6H   Continuous Infusions: . sodium chloride    . sodium chloride    . sodium chloride    . lactated ringers 100 mL/hr at 11/28/17 0542  . methocarbamol (ROBAXIN)  IV Stopped (12/01/17 1834)   PRN Meds:.HYDROmorphone (DILAUDID) injection, methocarbamol (ROBAXIN)  IV, ondansetron **OR** ondansetron (ZOFRAN) IV, oxyCODONE, sodium chloride flush  Xrays No results found.  Assessment/Plan: S/P Procedure(s) (LRB): INTRAMEDULLARY (IM) NAIL FEMORAL (Left) OPEN REDUCTION INTERNAL FIXATION (ORIF) POSTERIOR WALL ACETABULAR FRACTURE (Left) REMOVAL EXTERNAL FIXATION LEG (Left)  1 hemodyn stable, sinus tachy improved rate. No fevers 2 no new labs, will repeat for am 3 CXR pending- cont to push mobilization and pulm toilet as able.  4 other management per trauma and ortho   LOS: 7 days    Rowe ClackWayne E Jalaysia Lobb 12/02/2017

## 2017-12-03 LAB — CBC WITH DIFFERENTIAL/PLATELET
Basophils Absolute: 0 10*3/uL (ref 0.0–0.1)
Basophils Relative: 0 %
Eosinophils Absolute: 0.2 10*3/uL (ref 0.0–0.7)
Eosinophils Relative: 1 %
HEMATOCRIT: 27.5 % — AB (ref 36.0–46.0)
HEMOGLOBIN: 8.8 g/dL — AB (ref 12.0–15.0)
LYMPHS ABS: 2.4 10*3/uL (ref 0.7–4.0)
LYMPHS PCT: 15 %
MCH: 28.5 pg (ref 26.0–34.0)
MCHC: 32 g/dL (ref 30.0–36.0)
MCV: 89 fL (ref 78.0–100.0)
MONOS PCT: 8 %
Monocytes Absolute: 1.2 10*3/uL — ABNORMAL HIGH (ref 0.1–1.0)
NEUTROS ABS: 12.4 10*3/uL — AB (ref 1.7–7.7)
NEUTROS PCT: 76 %
Platelets: 376 10*3/uL (ref 150–400)
RBC: 3.09 MIL/uL — AB (ref 3.87–5.11)
RDW: 15.1 % (ref 11.5–15.5)
WBC: 16.2 10*3/uL — AB (ref 4.0–10.5)

## 2017-12-03 LAB — BASIC METABOLIC PANEL
Anion gap: 7 (ref 5–15)
BUN: 10 mg/dL (ref 6–20)
CO2: 27 mmol/L (ref 22–32)
CREATININE: 0.66 mg/dL (ref 0.44–1.00)
Calcium: 7.7 mg/dL — ABNORMAL LOW (ref 8.9–10.3)
Chloride: 103 mmol/L (ref 101–111)
GFR calc Af Amer: >60 mL/min (ref >60)
GFR calc non Af Amer: >60 mL/min (ref >60)
Glucose, Bld: 110 mg/dL — ABNORMAL HIGH (ref 65–99)
POTASSIUM: 4.7 mmol/L (ref 3.5–5.1)
Sodium: 137 mmol/L (ref 135–145)

## 2017-12-03 LAB — CBC
HEMATOCRIT: 28.8 % — AB (ref 36.0–46.0)
Hemoglobin: 9.2 g/dL — ABNORMAL LOW (ref 12.0–15.0)
MCH: 28.5 pg (ref 26.0–34.0)
MCHC: 31.9 g/dL (ref 30.0–36.0)
MCV: 89.2 fL (ref 78.0–100.0)
Platelets: 370 10*3/uL (ref 150–400)
RBC: 3.23 MIL/uL — ABNORMAL LOW (ref 3.87–5.11)
RDW: 15.3 % (ref 11.5–15.5)
WBC: 18.5 10*3/uL — AB (ref 4.0–10.5)

## 2017-12-03 MED ORDER — CALCIUM CARBONATE 1250 (500 CA) MG PO TABS
1000.0000 mg | ORAL_TABLET | Freq: Once | ORAL | Status: AC
  Administered 2017-12-03: 1000 mg via ORAL
  Filled 2017-12-03: qty 2

## 2017-12-03 MED ORDER — BOOST / RESOURCE BREEZE PO LIQD CUSTOM
1.0000 | Freq: Three times a day (TID) | ORAL | Status: DC
  Administered 2017-12-03 – 2017-12-06 (×7): 1 via ORAL

## 2017-12-03 MED ORDER — TAB-A-VITE/IRON PO TABS
1.0000 | ORAL_TABLET | Freq: Every day | ORAL | Status: DC
  Administered 2017-12-03 – 2017-12-06 (×4): 1 via ORAL
  Filled 2017-12-03 (×4): qty 1

## 2017-12-03 MED ORDER — SODIUM CHLORIDE 0.9 % IV BOLUS
500.0000 mL | Freq: Once | INTRAVENOUS | Status: AC
  Administered 2017-12-03: 500 mL via INTRAVENOUS

## 2017-12-03 MED ORDER — DOCUSATE SODIUM 100 MG PO CAPS
100.0000 mg | ORAL_CAPSULE | Freq: Every day | ORAL | Status: DC
  Administered 2017-12-03 – 2017-12-06 (×4): 100 mg via ORAL
  Filled 2017-12-03 (×4): qty 1

## 2017-12-03 MED ORDER — ACETAMINOPHEN 500 MG PO TABS
1000.0000 mg | ORAL_TABLET | Freq: Three times a day (TID) | ORAL | Status: DC
  Administered 2017-12-03 – 2017-12-06 (×10): 1000 mg via ORAL
  Filled 2017-12-03 (×10): qty 2

## 2017-12-03 MED ORDER — SODIUM CHLORIDE 0.9 % IV BOLUS: 1000.0000 mL | Freq: Once | INTRAVENOUS | Status: DC

## 2017-12-03 MED ORDER — OXYCODONE HCL 5 MG PO TABS
10.0000 mg | ORAL_TABLET | ORAL | Status: DC | PRN
Start: 2017-12-03 — End: 2017-12-06
  Administered 2017-12-03 – 2017-12-06 (×11): 10 mg via ORAL
  Filled 2017-12-03 (×11): qty 2

## 2017-12-03 MED ORDER — METHOCARBAMOL 500 MG PO TABS
500.0000 mg | ORAL_TABLET | Freq: Three times a day (TID) | ORAL | Status: DC
  Administered 2017-12-03 – 2017-12-06 (×10): 500 mg via ORAL
  Filled 2017-12-03 (×10): qty 1

## 2017-12-03 MED ORDER — HYDROMORPHONE HCL 1 MG/ML IJ SOLN
0.5000 mg | INTRAMUSCULAR | Status: DC | PRN
  Administered 2017-12-03 – 2017-12-05 (×9): 1 mg via INTRAVENOUS
  Filled 2017-12-03 (×9): qty 1

## 2017-12-03 NOTE — Progress Notes (Signed)
Met with pt and family to discuss transport options for discharge.  Pt planning on dc home with mother, who lives in [place].  I have made contact with several ground transport agencies, who estimate cost of ground transport from [place] to [place] from $3000-4000.  Insurance will not cover cost of transport, as it is non-emergent; costs will all be out of pocket.  Pt/family not interested in using transport agency due to cost.  They do have a van available, but do have concerns about pt's comfort during transport.  Pt will need to be weaned completely off oxygen prior to dc.   Will arrange recommended DME prior to dc home.  Would request that PT provide family with transfer techniques for getting pt in and out of van.    Reinaldo Raddle, RN, BSN  Trauma/Neuro ICU Case Manager [phone]
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
## 2017-12-03 NOTE — Progress Notes (Signed)
      [address]       [place] [id number]             [phone]      5 Days Post-Op Procedure(s) (LRB): INTRAMEDULLARY (IM) NAIL FEMORAL (Left) OPEN REDUCTION INTERNAL FIXATION (ORIF) POSTERIOR WALL ACETABULAR FRACTURE (Left) REMOVAL EXTERNAL FIXATION LEG (Left) Subjective: Feels good this morning. Pain is well controlled.   Objective: Vital signs in last 24 hours: Temp:  [98.3 F (36.8 C)-98.5 F (36.9 C)] 98.3 F (36.8 C) (04/15 0400) Pulse Rate:  [108] 108 (04/14 1600) Cardiac Rhythm: Sinus tachycardia (04/15 0310) Resp:  [14-20] 14 (04/15 0400) BP: (95-121)/(61-79) 110/71 (04/15 0400) SpO2:  [96 %-98 %] 96 % (04/15 0400) Weight:  [154 lb 15.7 oz (70.3 kg)] 154 lb 15.7 oz (70.3 kg) (04/15 0359)     Intake/Output from previous day: 04/14 0701 - 04/15 0700 In: 990 [P.O.:990] Out: 1750 [Urine:1750] Intake/Output this shift: No intake/output data recorded.  General appearance: alert, cooperative and no distress Heart: regular rate and rhythm, S1, S2 normal, no murmur, click, rub or gallop Lungs: clear to auscultation bilaterally Abdomen: soft, non-tender, hypoactive bowel sounds Extremities: no edema, good distal pulses Wound: clean and dry pericardial window incision  Lab Results: Recent Labs    12/01/17 0426 12/03/17 0415  WBC 16.6* 16.2*  HGB 9.5* 8.8*  HCT 29.8* 27.5*  PLT 248 376   BMET:  Recent Labs    12/01/17 0426 12/03/17 0415  NA 138 137  K 3.0* 4.7  CL 103 103  CO2 24 27  GLUCOSE 100* 110*  BUN 10 10  CREATININE 0.74 0.66  CALCIUM 8.2* 7.7*    PT/INR: No results for input(s): LABPROT, INR in the last 72 hours. ABG    Component Value Date/Time   PHART 7.379 11/28/2017 1458   HCO3 22.3 11/28/2017 1458   TCO2 23 11/28/2017 1458   ACIDBASEDEF 3.0 (H) 11/28/2017 1458   O2SAT 90.0 11/28/2017 1458   CBG (last 3)  No results for input(s): GLUCAP in the last 72 hours.  Assessment/Plan: S/P Procedure(s)  (LRB): INTRAMEDULLARY (IM) NAIL FEMORAL (Left) OPEN REDUCTION INTERNAL FIXATION (ORIF) POSTERIOR WALL ACETABULAR FRACTURE (Left) REMOVAL EXTERNAL FIXATION LEG (Left)  1. CV-ST in the low 100s. BP well controlled.  2. Pulm-tolerating room air with good oxygen saturation. Last CXR was okay with improved aeration in both lungs and some atelectasis.  3. Renal-creatinine 0.66, electrolytes okay. 4. H and H 8.8/27.5, platelets 376k 5. WBC count trending up 16.2 today, may be due to some atelectasis. Afebrile. Continue to trend.  6. Limited to bed too chair transfer.   Plan: Sounds like discharge in the next few days. Will order chest tube suture removal for discharge.    LOS: 8 days    Sharlene Doryessa N Cleopha Indelicato 12/03/2017
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
## 2017-12-04 LAB — CBC WITH DIFFERENTIAL/PLATELET
BASOS ABS: 0 10*3/uL (ref 0.0–0.1)
Basophils Relative: 0 %
Eosinophils Absolute: 0.4 10*3/uL (ref 0.0–0.7)
Eosinophils Relative: 3 %
HEMATOCRIT: 28.2 % — AB (ref 36.0–46.0)
Hemoglobin: 8.9 g/dL — ABNORMAL LOW (ref 12.0–15.0)
LYMPHS PCT: 14 %
Lymphs Abs: 2 10*3/uL (ref 0.7–4.0)
MCH: 28 pg (ref 26.0–34.0)
MCHC: 31.6 g/dL (ref 30.0–36.0)
MCV: 88.7 fL (ref 78.0–100.0)
MONO ABS: 1.1 10*3/uL — AB (ref 0.1–1.0)
MONOS PCT: 8 %
NEUTROS ABS: 10.5 10*3/uL — AB (ref 1.7–7.7)
Neutrophils Relative %: 75 %
Platelets: 437 10*3/uL — ABNORMAL HIGH (ref 150–400)
RBC: 3.18 MIL/uL — ABNORMAL LOW (ref 3.87–5.11)
RDW: 15.1 % (ref 11.5–15.5)
WBC: 14 10*3/uL — ABNORMAL HIGH (ref 4.0–10.5)

## 2017-12-04 LAB — BASIC METABOLIC PANEL
ANION GAP: 9 (ref 5–15)
BUN: 9 mg/dL (ref 6–20)
CALCIUM: 8.4 mg/dL — AB (ref 8.9–10.3)
CHLORIDE: 103 mmol/L (ref 101–111)
CO2: 24 mmol/L (ref 22–32)
Creatinine, Ser: 0.62 mg/dL (ref 0.44–1.00)
GFR calc Af Amer: >60 mL/min (ref >60)
GFR calc non Af Amer: >60 mL/min (ref >60)
Glucose, Bld: 105 mg/dL — ABNORMAL HIGH (ref 65–99)
Potassium: 3.8 mmol/L (ref 3.5–5.1)
Sodium: 136 mmol/L (ref 135–145)
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
## 2017-12-04 NOTE — Progress Notes (Addendum)
      [address]       [place] [id number]             [phone]      6 Days Post-Op Procedure(s) (LRB): INTRAMEDULLARY (IM) NAIL FEMORAL (Left) OPEN REDUCTION INTERNAL FIXATION (ORIF) POSTERIOR WALL ACETABULAR FRACTURE (Left) REMOVAL EXTERNAL FIXATION LEG (Left) Subjective: Was able to eat pizza last night. Doing well this morning.   Objective: Vital signs in last 24 hours: Temp:  [98.4 F (36.9 C)-98.7 F (37.1 C)] 98.4 F (36.9 C) (04/16 0400) Pulse Rate:  [90-108] 90 (04/16 0400) Cardiac Rhythm: Normal sinus rhythm (04/15 1950) Resp:  [16-22] 22 (04/16 0400) BP: (102-113)/(61-75) 102/70 (04/16 0400) SpO2:  [96 %-97 %] 96 % (04/16 0400) Weight:  [154 lb (69.9 kg)] 154 lb (69.9 kg) (04/16 0507)     Intake/Output from previous day: 04/15 0701 - 04/16 0700 In: 330 [P.O.:300; I.V.:30] Out: 625 [Urine:625] Intake/Output this shift: No intake/output data recorded.  General appearance: alert, cooperative and no distress Heart: regular rate and rhythm, S1, S2 normal, no murmur, click, rub or gallop Lungs: clear to auscultation bilaterally Abdomen: soft, non-tender; bowel sounds normal; no masses,  no organomegaly Extremities: extremities normal, atraumatic, no cyanosis or edema Wound: clean and dry  Lab Results: Recent Labs    12/03/17 0415 12/03/17 1445  WBC 16.2* 18.5*  HGB 8.8* 9.2*  HCT 27.5* 28.8*  PLT 376 370   BMET:  Recent Labs    12/03/17 0415  NA 137  K 4.7  CL 103  CO2 27  GLUCOSE 110*  BUN 10  CREATININE 0.66  CALCIUM 7.7*    PT/INR: No results for input(s): LABPROT, INR in the last 72 hours. ABG    Component Value Date/Time   PHART 7.379 11/28/2017 1458   HCO3 22.3 11/28/2017 1458   TCO2 23 11/28/2017 1458   ACIDBASEDEF 3.0 (H) 11/28/2017 1458   O2SAT 90.0 11/28/2017 1458   CBG (last 3)  No results for input(s): GLUCAP in the last 72 hours.  Assessment/Plan: S/P Procedure(s) (LRB): INTRAMEDULLARY (IM) NAIL  FEMORAL (Left) OPEN REDUCTION INTERNAL FIXATION (ORIF) POSTERIOR WALL ACETABULAR FRACTURE (Left) REMOVAL EXTERNAL FIXATION LEG (Left)  1. CV-NSR int he 90s. BP well controlled.  2. Pulm-tolerating 2L Walker. Last CXR was okay with improved aeration in both lungs and some atelectasis.  3. Renal-creatinine 0.66, electrolytes okay. 4. H and H 9.2/28.8, platelets 370k 5. WBC count trending up 18.5 today.  Afebrile. Continue to trend.  6. Limited to bed too chair transfer.   Plan: Possibly discharge soon. I contacted our office for a follow-up visit.    LOS: 9 days    Sharlene Doryessa N Conte 12/04/2017 Epigastric incision looks good, dc chest tube suture No cardiac surgical issues at this point Will need a follow up for a wound check  Viviann SpareSteven C. Dorris FetchHendrickson, MD [facility] [phone]

## 2017-12-04 NOTE — Progress Notes (Addendum)
CSW received handoff to complete SBIRT with pt. CSW following up at this time.   6:35 PM CSW met with pt and pt's family member. CSW provided support. Pt and pt's family member denied immediate need for resources at this time. Pt stated her boss from work came by to complete an application for [employer] and Short term disability to prevent her from losing her job and to provide any assistance financially.   CSW asked pt about SBIRT (substance/ alcohol abuse). Pt denied abuse of any substances.   Wendelyn Breslow, [facility]  [phone]

## 2017-12-05 LAB — URINALYSIS, ROUTINE W REFLEX MICROSCOPIC
BILIRUBIN URINE: NEGATIVE
Glucose, UA: NEGATIVE mg/dL
Ketones, ur: 5 mg/dL — AB
LEUKOCYTES UA: NEGATIVE
Nitrite: NEGATIVE
PROTEIN: NEGATIVE mg/dL
SPECIFIC GRAVITY, URINE: 1.018 (ref 1.005–1.030)
pH: 6 (ref 5.0–8.0)
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
## 2017-12-05 NOTE — Progress Notes (Signed)
      [address]       [place] [id number]             [phone]    7 Days Post-Op Procedure(s) (LRB): INTRAMEDULLARY (IM) NAIL FEMORAL (Left) OPEN REDUCTION INTERNAL FIXATION (ORIF) POSTERIOR WALL ACETABULAR FRACTURE (Left) REMOVAL EXTERNAL FIXATION LEG (Left) Subjective: Doing okay today. Planned for discharge tomorrow.   Objective: Vital signs in last 24 hours: Temp:  [98.5 F (36.9 C)-99.3 F (37.4 C)] 98.5 F (36.9 C) (04/17 0319) Pulse Rate:  [93-110] 107 (04/16 2315) Cardiac Rhythm: Normal sinus rhythm (04/17 0700) Resp:  [13-20] 14 (04/17 0319) BP: (94-106)/(73-76) 94/73 (04/17 0319) SpO2:  [92 %-98 %] 93 % (04/17 0319) Weight:  [151 lb 0.2 oz (68.5 kg)] 151 lb 0.2 oz (68.5 kg) (04/17 0319)     Intake/Output from previous day: 04/16 0701 - 04/17 0700 In: 1670 [P.O.:520; I.V.:1150] Out: 2060 [Urine:2060] Intake/Output this shift: No intake/output data recorded.  General appearance: alert, cooperative and no distress Heart: regular rate and rhythm, S1, S2 normal, no murmur, click, rub or gallop Lungs: clear to auscultation bilaterally Abdomen: soft, non-tender; bowel sounds normal; no masses,  no organomegaly Extremities: extremities normal, atraumatic, no cyanosis or edema Wound: pericardial window incision is well healed, drain sutures have been removed  Lab Results: Recent Labs    12/03/17 1445 12/04/17 0954  WBC 18.5* 14.0*  HGB 9.2* 8.9*  HCT 28.8* 28.2*  PLT 370 437*   BMET:  Recent Labs    12/03/17 0415 12/04/17 0954  NA 137 136  K 4.7 3.8  CL 103 103  CO2 27 24  GLUCOSE 110* 105*  BUN 10 9  CREATININE 0.66 0.62  CALCIUM 7.7* 8.4*    PT/INR: No results for input(s): LABPROT, INR in the last 72 hours. ABG    Component Value Date/Time   PHART 7.379 11/28/2017 1458   HCO3 22.3 11/28/2017 1458   TCO2 23 11/28/2017 1458   ACIDBASEDEF 3.0 (H) 11/28/2017 1458   O2SAT 90.0 11/28/2017 1458   CBG (last 3)  No results  for input(s): GLUCAP in the last 72 hours.  Assessment/Plan: S/P Procedure(s) (LRB): INTRAMEDULLARY (IM) NAIL FEMORAL (Left) OPEN REDUCTION INTERNAL FIXATION (ORIF) POSTERIOR WALL ACETABULAR FRACTURE (Left) REMOVAL EXTERNAL FIXATION LEG (Left)  1. CV-NSR int he 90s. BP well controlled.  2. Pulm-tolerating room with good oxygen saturation. Last CXR was okay with improved aeration in both lungs and some atelectasis.  3. Renal-creatinine 0.62, electrolytes okay. 4. H and H 8.9/28.2, platelets 437k 5. WBC count trending down to 14.0.  Afebrile. Continue to trend.  6.Limited to bed too chair transfer.   Plan: Possibly discharge soon. Appointment with our group in a couple weeks, although might not be able to make due to being out of town.       LOS: 10 days    Kristen Cook 12/05/2017
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
## 2017-12-06 ENCOUNTER — Inpatient Hospital Stay (HOSPITAL_COMMUNITY): Payer: 59

## 2017-12-06 DIAGNOSIS — I313 Pericardial effusion (noninflammatory): Secondary | ICD-10-CM

## 2017-12-06 LAB — ECHOCARDIOGRAM COMPLETE
HEIGHTINCHES: 65 in
WEIGHTICAEL: 2320 [oz_av]

## 2017-12-06 MED ORDER — METHOCARBAMOL 500 MG PO TABS: 500.0000 mg | ORAL_TABLET | Freq: Three times a day (TID) | ORAL | 0 refills | Status: AC

## 2017-12-06 MED ORDER — DOCUSATE SODIUM 100 MG PO CAPS: 100.0000 mg | ORAL_CAPSULE | Freq: Every day | ORAL | 0 refills | Status: AC

## 2017-12-06 MED ORDER — TRAMADOL HCL 50 MG PO TABS: 50.0000 mg | ORAL_TABLET | Freq: Four times a day (QID) | ORAL | 0 refills | Status: AC

## 2017-12-06 MED ORDER — ENOXAPARIN SODIUM 40 MG/0.4ML ~~LOC~~ SOLN: 40.0000 mg | SUBCUTANEOUS | 0 refills | Status: AC

## 2017-12-06 MED ORDER — LORAZEPAM 1 MG PO TABS
1.0000 mg | ORAL_TABLET | Freq: Once | ORAL | Status: AC
  Administered 2017-12-06: 1 mg via ORAL
  Filled 2017-12-06: qty 1

## 2017-12-06 MED ORDER — ACETAMINOPHEN 500 MG PO TABS: 1000.0000 mg | ORAL_TABLET | Freq: Three times a day (TID) | ORAL | 0 refills | Status: AC

## 2017-12-06 MED ORDER — OXYCODONE HCL 10 MG PO TABS: 10.0000 mg | ORAL_TABLET | Freq: Four times a day (QID) | ORAL | 0 refills | Status: AC | PRN

## 2017-12-06 MED ORDER — POLYETHYLENE GLYCOL 3350 17 G PO PACK: 17.0000 g | PACK | Freq: Every day | ORAL | 0 refills | Status: AC

## 2017-12-06 MED ORDER — TAB-A-VITE/IRON PO TABS: 1.0000 | ORAL_TABLET | Freq: Every day | ORAL | 0 refills | Status: AC

## 2017-12-06 NOTE — Discharge Instructions (Signed)
Kristen Cook!  Good luck with your recovery, here are the recommendations from your orthopedic surgeons, your trauma surgeons, and your cardiothoracic surgeons. No weight bearing of your left lower extremity Weight bearing as tolerated for transfers only for your Right lower extremity Bed to chair only for 5 additional weeks with assistance  suture removal in 6 daysby orthopedic surgery office in [place], please have them take a look at your chest incisions as well Continueankle theraband program, heel cord stretching, toe towel curls, etc No pillows under bend of knee when at rest, ok to place under heel to help work on extension. Can also use zero knee bone foam if available Keep legs bent to 90 degrees or in full extension  Ok to shower and clean wounds with soap and water     Opioid Pain Medicine Information Opioids are powerful medicines that are used to treat moderate to severe pain. Opioids should be taken with the supervision of a trained health care provider. They should be taken for the shortest period of time as possible. This is because opioids can be addictive and the longer you take opioids, the greater your risk of addiction (opioid use disorder). What do opioids do? Opioids help to reduce or eliminate pain. When used for short periods of time, they can help you:  Sleep better.  Do better in physical or occupational therapy.  Feel better in the first few days after an injury.  Recover from surgery.  What is a pain treatment plan? A pain treatment plan is an agreement between you and your health care provider. Pain is unique to each person, and treatments vary depending on your condition. To manage your pain successfully, you and your health care provider need to understand each other and work together. To help you do this:  Discuss the goals of your treatment, including how much pain you might expect to have and how you will manage the pain.  Review the risks  and benefits of taking opioid medicines for your condition.  Remember that a good treatment plan uses more than one approach and minimizes the chance of side effects.  Be honest about the amount of medicines you take, and about any drug or alcohol use.  Get pain medicine prescriptions from only one care provider.  Keep all follow-up visits as told by your health care provider. This is important.  What instructions should I follow while taking opioid pain medicine? While you are taking the medicine and for 8 hours after you stop taking the medicine, follow these instructions:  Do not drive.  Do not use machinery or power tools.  Do not sign legal documents.  Do not drink alcohol.  Do not take sleeping pills.  Do not supervise children by yourself.  Do not participate in activities that require climbing or being in high places.  Do not enter a body of water--such as a lake, river, ocean, spa, or swimming pool--unless an adult is nearby who can monitor and help you.  What kinds of side effects can opioids cause? Opioids can cause side effects, such as:  Constipation.  Nausea.  Vomiting.  Drowsiness.  Confusion.  Opioid use disorder.  Breathing difficulties (respiratory depression).  Using opioid pain medicines for longer than 3 days increases your risk of these side effects. Taking opioid pain medicine for a long period of time can affect your ability to do daily tasks. It also puts you at risk for:  Motor vehicle accidents.  Depression.  Suicide.  Heart attack.  Overdose, which can sometimes lead to death.  What are alternative ways to manage pain? Pain can be managed with many types of alternative treatments. Ask your health care provider to refer you to one or more specialists who can help you manage pain through:  Physical or occupational therapy.  Counseling (cognitive behavioral therapy).  Good  nutrition.  Biofeedback.  Massage.  Meditation.  Non-opioid medicine.  Following a gentle exercise program.  How can I keep others safe while I am taking opioid pain medicine?  Keep pain medicine in a locked cabinet, or in a secure area where children cannot reach it.  Never share your pain medicine with anyone.  Do not save any leftover pills. If you have leftover medicine, you can: 1. Bring the medicine to a prescription take-back program. This is usually offered by the county or Patent examiner. 2. Throw it out in the trash. To do this:  Mix the medicine with undesirable trash such as pet waste or food.  Put the mixture in a sealed container or plastic bag.  Throw it in the trash.  Destroy any personal information on the prescription bottle. How do I stop taking opioids if I have been taking them for a long time? If you have been taking opioid medicine for more than a few weeks, you may need to slowly decrease (taper) how much you take until you stop completely. Tapering your use of opioids can decrease your chances of experiencing withdrawal symptoms, such as:  Pain and cramping in the abdomen.  Nausea.  Sweating.  Sleepiness.  Restlessness.  Uncontrollable shaking (tremors).  Cravings for the medicine.  Do not attempt to taper your use of opioids on your own. Talk with your health care provider about how to do this. Your health care provider may prescribe a step-down schedule based on how much medicine you are taking and how long you have been taking it. Where to find support: If you have been taking opioids for a long time, you may benefit from receiving support for quitting from a local support group or counselor. Ask your health care provider for a referral to these resources in your area. Where to find more information:  Centers for Disease Control and Prevention (CDC): DiscoHelp.si Get help right away if: Seek medical care  right away if you are taking opioids and you (or people close to you) notice any of the following:  Difficulty breathing.  Breathing that is slower or more shallow than normal.  A very slow heartbeat (pulse).  Severe confusion.  Unconsciousness.  Sleepiness.  Slurred speech.  Nausea and vomiting.  Cold, clammy skin.  Blue lips or fingernails.  Limpness.  Abnormally small pupils.  If you think that you or someone else may have taken too much of an opioid medicine, get medical help right away. Do not wait to see if the symptoms go away on their own.  If you ever feel like you may hurt yourself or others, or have thoughts about taking your own life, get help right away. You can go to your nearest emergency department or call:  Your local emergency services (911 in the [place]  The hotline of the [facility] 847-573-5329 in the [place].).  A suicide crisis helpline, such as the National Suicide Prevention Lifeline at 205-871-3689. This is open 24 hours a day.  Summary  Opioid medicines can help you manage moderate to severe pain for a short period of time.  Discuss the goals  of your treatment with your health care provider, including how much pain you might expect to have and how you will manage the pain.  A good treatment plan uses more than one approach. Pain can be managed with many types of alternative treatments.  If you think that you or someone else may have taken too much of an opioid, get medical help right away.  Kristen Cook
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
## 2017-12-06 NOTE — Progress Notes (Signed)
  Echocardiogram 2D Echocardiogram has been performed.  Kristen Cook  Kristen Cook 12/06/2017, 10:53 AM
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
## 2017-12-06 NOTE — Progress Notes (Signed)
D/C this afternoon 12/06/17 with Mother, Father & brother.  Discharge summary reviewed by mother.  Lovenox injection teaching with mother today and performed injection with nurse supervision.  Hard copy scripts given to father [facility].  Pt will be assisted by RN, NT. PT and family.

## 2017-12-18 ENCOUNTER — Other Ambulatory Visit: Payer: Self-pay | Admitting: Thoracic Surgery (Cardiothoracic Vascular Surgery)

## 2017-12-18 ENCOUNTER — Ambulatory Visit: Payer: Self-pay | Admitting: Thoracic Surgery (Cardiothoracic Vascular Surgery)

## 2017-12-18 DIAGNOSIS — I312 Hemopericardium, not elsewhere classified: Secondary | ICD-10-CM
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
